# Patient Record
Sex: Female | Born: 1970 | Race: White | Hispanic: No | Marital: Married | Smoking: Never smoker
Health system: Southern US, Community
[De-identification: ages and names within clinical notes are randomized; demographics above are authoritative.]

## PROBLEM LIST (undated history)

## (undated) DIAGNOSIS — K5900 Constipation, unspecified: Secondary | ICD-10-CM

## (undated) DIAGNOSIS — R0602 Shortness of breath: Secondary | ICD-10-CM

## (undated) DIAGNOSIS — M7989 Other specified soft tissue disorders: Secondary | ICD-10-CM

## (undated) DIAGNOSIS — K219 Gastro-esophageal reflux disease without esophagitis: Secondary | ICD-10-CM

## (undated) DIAGNOSIS — M2550 Pain in unspecified joint: Secondary | ICD-10-CM

## (undated) DIAGNOSIS — C801 Malignant (primary) neoplasm, unspecified: Secondary | ICD-10-CM

## (undated) HISTORY — DX: Other specified soft tissue disorders: M79.89

## (undated) HISTORY — DX: Gastro-esophageal reflux disease without esophagitis: K21.9

## (undated) HISTORY — DX: Shortness of breath: R06.02

## (undated) HISTORY — DX: Pain in unspecified joint: M25.50

## (undated) HISTORY — PX: CARDIAC CATHETERIZATION: SHX172

## (undated) HISTORY — DX: Constipation, unspecified: K59.00

## (undated) HISTORY — PX: PORTA CATH REMOVAL: CATH118286

## (undated) HISTORY — PX: ABDOMINAL HYSTERECTOMY: SHX81

---

## 1998-02-19 HISTORY — PX: MENISCUS REPAIR: SHX5179

## 2010-02-19 HISTORY — PX: OTHER SURGICAL HISTORY: SHX169

## 2015-07-04 ENCOUNTER — Encounter: Payer: Self-pay | Admitting: Surgery

## 2015-07-04 ENCOUNTER — Encounter (HOSPITAL_COMMUNITY): Payer: Self-pay

## 2016-07-03 ENCOUNTER — Encounter (HOSPITAL_COMMUNITY): Payer: Self-pay | Admitting: Emergency Medicine

## 2016-07-03 ENCOUNTER — Ambulatory Visit (HOSPITAL_COMMUNITY)
Admission: EM | Admit: 2016-07-03 | Discharge: 2016-07-03 | Disposition: A | Discharge status: Home / Self Care | Payer: BC Managed Care – PPO | Attending: Internal Medicine | Admitting: Internal Medicine

## 2016-07-03 DIAGNOSIS — H109 Unspecified conjunctivitis: Secondary | ICD-10-CM

## 2016-07-03 MED ORDER — FLUORESCEIN SODIUM 0.6 MG OP STRP
ORAL_STRIP | OPHTHALMIC | Status: AC
  Filled 2016-07-03: qty 1

## 2016-07-03 MED ORDER — CIPROFLOXACIN HCL 0.3 % OP SOLN: OPHTHALMIC | 0 refills | Status: DC

## 2016-07-03 MED ORDER — EYE WASH OPHTH SOLN
OPHTHALMIC | Status: AC
  Filled 2016-07-03: qty 118

## 2016-07-03 MED ORDER — TETRACAINE HCL 0.5 % OP SOLN
OPHTHALMIC | Status: AC
  Filled 2016-07-03: qty 2

## 2016-07-03 NOTE — Discharge Instructions (Signed)
You have bacterial conjunctivitis, I prescribed Cipro eyedrops, place 2 drops in the affected eye every 2 hours while awake for the next 2 days, then 2 drops in the affected eye every 4-8 hours for the next 5 days. You are contagious, however after 24 hours on antibiotics this will reduce the chance of spreading. If your vision worsens, develops pain, or other troubling symptoms, go to the emergency room.

## 2016-07-03 NOTE — ED Triage Notes (Signed)
The patient presented to the [facility] with a complaint of right eye burning and redness that started today.

## 2016-07-03 NOTE — ED Provider Notes (Signed)
CSN: [id number]     Arrival date & time 07/03/16  1621 History   First MD Initiated Contact with Patient 07/03/16 1722     Chief Complaint  Patient presents with  . Eye Drainage   (Consider location/radiation/quality/duration/timing/severity/associated sxs/prior Treatment) 46 year old female presents to clinic for evaluation of red eye, and drainage. That started today. She works in a [employer], structural system, and was around Tour manager, who had conjunctivitis. She denies any pain, and she denies any trouble with her vision. She normally wears contacts, however she took them out today, is been wearing glasses. She has no fever, headache, chills, nausea, or any other complaints.   The history is provided by the patient.    History reviewed. No pertinent past medical history. Past Surgical History:  Procedure Laterality Date  . ABDOMINAL HYSTERECTOMY     History reviewed. No pertinent family history. Social History  Substance Use Topics  . Smoking status: Never Smoker  . Smokeless tobacco: Never Used  . Alcohol use Yes   OB History    No data available     Review of Systems  Constitutional: Negative.   HENT: Negative.   Eyes: Positive for discharge, redness and itching. Negative for photophobia and pain.  Respiratory: Negative.   Cardiovascular: Negative.   Gastrointestinal: Negative.   Musculoskeletal: Negative.   Skin: Negative.   Neurological: Negative.     Allergies  Patient has no known allergies.  Home Medications   Prior to Admission medications   Medication Sig Start Date End Date Taking? Authorizing Provider  ciprofloxacin (CILOXAN) 0.3 % ophthalmic solution Administer 2 drops in right eye, every 2 hours, while awake, for 2 days. Then 2 drop, every 4 hours, while awake, for the next 5 days. 07/03/16   Barnet Glasgow, NP   Meds Ordered and Administered this Visit  Medications - No data to display  BP 123/61 (BP Location: Right Arm)   Pulse 73   Temp 98.2 F  (36.8 C) (Oral)   Resp 16   SpO2 100%  No data found.   Physical Exam  Constitutional: She is oriented to person, place, and time. She appears well-developed and well-nourished. No distress.  HENT:  Head: Normocephalic and atraumatic.  Right Ear: External ear normal.  Left Ear: External ear normal.  Eyes: Lids are normal. Pupils are equal, round, and reactive to light. Right eye exhibits chemosis and discharge. Right conjunctiva is injected.  Neck: Normal range of motion.  Lymphadenopathy:    She has no cervical adenopathy.  Neurological: She is alert and oriented to person, place, and time.  Skin: Skin is warm and dry. Capillary refill takes less than 2 seconds. No rash noted. She is not diaphoretic. No erythema.  Psychiatric: She has a normal mood and affect. Her behavior is normal.  Nursing note and vitals reviewed.   Urgent Care Course     Procedures (including critical care time)  Labs Review Labs Reviewed - No data to display  Imaging Review No results found.   Visual Acuity Review  Right Eye Distance: 20/70 Left Eye Distance: 2020 Bilateral Distance: 20/20  Right Eye Near:   Left Eye Near:    Bilateral Near:         MDM   1. Bacterial conjunctivitis    Most likely bacterial conjunctivitis, provided Cipro eyedrops. Provided counseling on hand hygiene, recommend following up with primary care provider in one week as needed, or go to the emergency room at any time symptoms worsen  Barnet Glasgow, NP 07/03/16 1733

## 2016-08-02 DIAGNOSIS — R928 Other abnormal and inconclusive findings on diagnostic imaging of breast: Secondary | ICD-10-CM

## 2016-08-02 DIAGNOSIS — Z1501 Genetic susceptibility to malignant neoplasm of breast: Secondary | ICD-10-CM | POA: Diagnosis not present

## 2017-04-23 DIAGNOSIS — S93401A Sprain of unspecified ligament of right ankle, initial encounter: Secondary | ICD-10-CM | POA: Insufficient documentation

## 2018-02-18 DIAGNOSIS — Z170 Estrogen receptor positive status [ER+]: Principal | ICD-10-CM

## 2018-02-18 DIAGNOSIS — C50511 Malignant neoplasm of lower-outer quadrant of right female breast: Secondary | ICD-10-CM | POA: Insufficient documentation

## 2018-02-19 HISTORY — PX: MASTECTOMY: SHX3

## 2018-02-19 HISTORY — PX: PORTA CATH INSERTION: CATH118285

## 2018-03-18 DIAGNOSIS — Z8042 Family history of malignant neoplasm of prostate: Secondary | ICD-10-CM

## 2018-03-18 DIAGNOSIS — Z8049 Family history of malignant neoplasm of other genital organs: Secondary | ICD-10-CM

## 2018-03-18 DIAGNOSIS — Z9013 Acquired absence of bilateral breasts and nipples: Secondary | ICD-10-CM

## 2018-03-18 DIAGNOSIS — C50511 Malignant neoplasm of lower-outer quadrant of right female breast: Secondary | ICD-10-CM

## 2018-03-18 DIAGNOSIS — Z1509 Genetic susceptibility to other malignant neoplasm: Secondary | ICD-10-CM

## 2018-03-18 DIAGNOSIS — Z8041 Family history of malignant neoplasm of ovary: Secondary | ICD-10-CM

## 2018-03-18 DIAGNOSIS — Z803 Family history of malignant neoplasm of breast: Secondary | ICD-10-CM

## 2018-03-18 DIAGNOSIS — Z170 Estrogen receptor positive status [ER+]: Secondary | ICD-10-CM

## 2018-03-18 DIAGNOSIS — Z90710 Acquired absence of both cervix and uterus: Secondary | ICD-10-CM

## 2018-03-18 DIAGNOSIS — Z1501 Genetic susceptibility to malignant neoplasm of breast: Secondary | ICD-10-CM

## 2018-03-26 DIAGNOSIS — C50511 Malignant neoplasm of lower-outer quadrant of right female breast: Secondary | ICD-10-CM

## 2018-03-26 DIAGNOSIS — Z01810 Encounter for preprocedural cardiovascular examination: Secondary | ICD-10-CM

## 2018-03-28 ENCOUNTER — Institutional Professional Consult (permissible substitution): Payer: BC Managed Care – PPO | Admitting: Plastic Surgery

## 2018-05-01 DIAGNOSIS — Z170 Estrogen receptor positive status [ER+]: Secondary | ICD-10-CM

## 2018-05-01 DIAGNOSIS — C50511 Malignant neoplasm of lower-outer quadrant of right female breast: Secondary | ICD-10-CM

## 2018-05-15 DIAGNOSIS — Z170 Estrogen receptor positive status [ER+]: Secondary | ICD-10-CM

## 2018-05-15 DIAGNOSIS — C50511 Malignant neoplasm of lower-outer quadrant of right female breast: Secondary | ICD-10-CM

## 2018-05-27 ENCOUNTER — Other Ambulatory Visit: Payer: Self-pay

## 2018-05-27 ENCOUNTER — Ambulatory Visit: Payer: BC Managed Care – PPO | Admitting: Plastic Surgery

## 2018-05-27 ENCOUNTER — Encounter

## 2018-05-27 ENCOUNTER — Encounter: Payer: Self-pay | Admitting: Plastic Surgery

## 2018-05-27 VITALS — BP 113/75 | HR 69 | Temp 97.7°F | Ht 63.0 in | Wt 165.0 lb

## 2018-05-27 DIAGNOSIS — C50511 Malignant neoplasm of lower-outer quadrant of right female breast: Secondary | ICD-10-CM

## 2018-05-27 DIAGNOSIS — Z9013 Acquired absence of bilateral breasts and nipples: Secondary | ICD-10-CM | POA: Diagnosis not present

## 2018-05-27 DIAGNOSIS — Z170 Estrogen receptor positive status [ER+]: Secondary | ICD-10-CM

## 2018-05-27 NOTE — Progress Notes (Signed)
Patient ID: Tina Monroe, female    DOB: 02-22-1970, 48 y.o.   MRN: [id number]   Chief Complaint  Patient presents with  . Advice Only    for breast reconstuction  . Breast Problem    Patient is a 48 year old female for consultation for breast reconstruction.  She was diagnosed with cancer and underwent bilateral mastectomies in January 2020 for a nonpalpable invasive lobular cancer.  It was estrogen and progesterone positive and HER-2 negative with a Ki-67 10%.  She finished her chemotherapy 2 weeks ago.  Prior to this, her previous mammogram was 2018.  She is 5 feet 3 inches tall and weighs 165 pounds.  Her preop bra size was a 36 B/C.  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  She has 3 grown daughters and very supportive husband.  Her Port-A-Cath is still in place.  She did not have any radiation.   Review of Systems  Constitutional: Negative.  Negative for activity change and appetite change.  HENT: Negative.   Eyes: Negative.   Respiratory: Negative.  Negative for chest tightness and shortness of breath.   Cardiovascular: Negative.   Gastrointestinal: Negative.  Negative for abdominal distention and abdominal pain.  Genitourinary: Negative.   Musculoskeletal: Negative.  Negative for joint swelling.  Hematological: Negative.   Psychiatric/Behavioral: Negative.     History reviewed. No pertinent past medical history.  Past Surgical History:  Procedure Laterality Date  . ABDOMINAL HYSTERECTOMY        Current Outpatient Medications:  .  amoxicillin-clavulanate (AUGMENTIN) 875-125 MG tablet, amoxicillin 875 mg-potassium clavulanate 125 mg tablet, Disp: , Rfl:  .  ibuprofen (ADVIL,MOTRIN) 600 MG tablet, Use as needed., Disp: , Rfl:  .  ondansetron (ZOFRAN) 4 MG tablet, Take 4 mg by mouth every 4 (four) hours as needed., Disp: , Rfl:  .  valACYclovir (VALTREX) 1000 MG tablet, valacyclovir 1 gram tablet, Disp: , Rfl:    Objective:   Vitals:   05/27/18 0825  BP: 113/75  Pulse: 69  Temp: 97.7 F (36.5 C)  SpO2: 100%    Physical Exam Vitals signs and nursing note reviewed.  Constitutional:      Appearance: Normal appearance.  HENT:     Head: Normocephalic and atraumatic.     Nose: Nose normal.     Mouth/Throat:     Mouth: Mucous membranes are moist.  Eyes:     Extraocular Movements: Extraocular movements intact.  Cardiovascular:     Rate and Rhythm: Normal rate.  Pulmonary:     Effort: Pulmonary effort is normal. No respiratory distress.  Chest:    Abdominal:     General: Abdomen is flat. There is no distension.  Neurological:     General: No focal deficit present.     Mental Status: She is alert.  Psychiatric:        Mood and Affect: Mood normal.        Behavior: Behavior normal.        Thought Content: Thought content normal.     Assessment & Plan:  Malignant neoplasm of lower-outer quadrant of right breast of female, estrogen receptor positive ([address])  History of bilateral mastectomy  Assessment and Plan:  A long, detailed conversation was had regarding the patient's options for breast reconstruction. Five main points, which are explained to all breast reconstruction patients, were discussed.  1. Breast reconstruction is an optional process.  2. Breast reconstruction is a multi-stage process which involves multiple surgeries  spaced several months apart. The entire process can take over one year.  3. The major goal of breast reconstruction is to have the patient look normal in clothing. When naked, there will always be scars.  4. Asymmetries are often present during the reconstruction process. Several operations may be needed, including surgery to the non-cancerous breast, to achieve satisfactory results.  5. No matter the reconstructive method, there are ways that the reconstruction can fail and a secondary reconstructive plan would need to be created.   A general discussion regarding all available methods  of breast reconstruction were discussed. The types of reconstructions described included.  1. Tissue expander and implant based reconstruction, both single and multi-stage approaches.  2. Autologous only reconstructions, including free abdominal-tissue based reconstructions.  3. Combination procedures, particularly latissismus dorsi flaps combined with either expanders or implants.  For each of the reconstruction methods mentioned above, the risks, benefits, alternatives, scarring, and recovery time were discussed in great detail. Specific risks detailed included bleeding, infection, hematoma, seroma, scarring, pain, wound healing complications, flap loss, fat necrosis, capsular contracture, need for implant removal, donor site complications, bulge, hernia, umbilical necrosis, need for urgent reoperation, and need for dressing changes were discussed.   Assessment  Once all reconstruction options were presented, a focused discussion was had regarding the patient's suitability for each of these procedures.  A total of 50 minutes of face-to-face time was spent in this encounter, of which >50% was spent in counseling.  The patient is interested in bilateral reconstruction with implant-based reconstruction.  She does not want to have autologous reconstruction.  She would like to do expander placement with Flex HD followed by implant placement after expansion.  She is thinking to do this around December.  She is an Environmental consultant principal.  She will call and let us know when she is ready to submit. Pictures placed in chart with patient consent.   [place], DO

## 2018-06-25 ENCOUNTER — Other Ambulatory Visit (HOSPITAL_COMMUNITY): Payer: Self-pay | Admitting: Oncology

## 2018-06-25 ENCOUNTER — Other Ambulatory Visit: Payer: Self-pay | Admitting: Oncology

## 2018-06-27 ENCOUNTER — Other Ambulatory Visit (HOSPITAL_COMMUNITY): Payer: Self-pay | Admitting: Oncology

## 2018-06-27 DIAGNOSIS — R931 Abnormal findings on diagnostic imaging of heart and coronary circulation: Secondary | ICD-10-CM

## 2018-07-01 ENCOUNTER — Telehealth (HOSPITAL_COMMUNITY): Payer: Self-pay | Admitting: Emergency Medicine

## 2018-07-01 NOTE — Telephone Encounter (Signed)
Reaching out to patient to offer assistance regarding upcoming cardiac imaging study; pt verbalizes understanding of appt date/time, parking situation and where to check in, and verified current allergies; name and call back number provided for further questions should they arise Marchia Bond RN Navigator Cardiac Imaging [place] [facility] [phone] office [phone] cell  Pt denies covid 19 symptoms, verbalized understanding of visitor policy  Pt was instructed to check in 15-30mins prior to appt She has a port-a-cath in chest, can use for IV access if needed

## 2018-07-02 ENCOUNTER — Ambulatory Visit (HOSPITAL_COMMUNITY)
Admission: RE | Admit: 2018-07-02 | Discharge: 2018-07-02 | Disposition: A | Discharge status: Home / Self Care | Payer: BC Managed Care – PPO | Source: Ambulatory Visit | Attending: Oncology | Admitting: Oncology

## 2018-07-02 ENCOUNTER — Other Ambulatory Visit: Payer: Self-pay

## 2018-07-02 ENCOUNTER — Other Ambulatory Visit (HOSPITAL_COMMUNITY): Payer: BC Managed Care – PPO

## 2018-07-02 DIAGNOSIS — R931 Abnormal findings on diagnostic imaging of heart and coronary circulation: Secondary | ICD-10-CM

## 2018-07-02 DIAGNOSIS — R9430 Abnormal result of cardiovascular function study, unspecified: Secondary | ICD-10-CM

## 2018-07-02 MED ORDER — GADOBUTROL 1 MMOL/ML IV SOLN
10.0000 mL | Freq: Once | INTRAVENOUS | Status: AC | PRN
  Administered 2018-07-02: 8 mL via INTRAVENOUS

## 2018-08-27 ENCOUNTER — Encounter: Payer: Self-pay | Admitting: Plastic Surgery

## 2018-08-27 ENCOUNTER — Other Ambulatory Visit: Payer: Self-pay

## 2018-08-27 ENCOUNTER — Ambulatory Visit (INDEPENDENT_AMBULATORY_CARE_PROVIDER_SITE_OTHER): Payer: BC Managed Care – PPO | Admitting: Plastic Surgery

## 2018-08-27 VITALS — BP 133/76 | HR 71 | Temp 98.4°F | Ht 63.0 in | Wt 170.4 lb

## 2018-08-27 DIAGNOSIS — Z9013 Acquired absence of bilateral breasts and nipples: Secondary | ICD-10-CM

## 2018-08-27 DIAGNOSIS — C50511 Malignant neoplasm of lower-outer quadrant of right female breast: Secondary | ICD-10-CM

## 2018-08-27 DIAGNOSIS — Z170 Estrogen receptor positive status [ER+]: Secondary | ICD-10-CM

## 2018-08-27 MED ORDER — CEPHALEXIN 500 MG PO CAPS: 500.0000 mg | ORAL_CAPSULE | Freq: Four times a day (QID) | ORAL | 0 refills | Status: AC

## 2018-08-27 MED ORDER — HYDROCODONE-ACETAMINOPHEN 5-325 MG PO TABS: 1.0000 | ORAL_TABLET | Freq: Four times a day (QID) | ORAL | 0 refills | Status: DC | PRN

## 2018-08-27 MED ORDER — ONDANSETRON HCL 4 MG PO TABS: 4.0000 mg | ORAL_TABLET | Freq: Three times a day (TID) | ORAL | 0 refills | Status: AC | PRN

## 2018-08-27 MED ORDER — DIAZEPAM 2 MG PO TABS: 2.0000 mg | ORAL_TABLET | Freq: Two times a day (BID) | ORAL | 0 refills | Status: AC | PRN

## 2018-08-27 NOTE — Progress Notes (Signed)
Patient ID: Tina Monroe, female    DOB: 03-13-1970, 48 y.o.   MRN: [id number]   Chief Complaint  Patient presents with  . Pre-op Exam    (B) breast reconstruction w/ expanders and flex HD  . Breast Cancer    The patient is a 48 yrs old female here for a history and physical for breast reconstruction.  She underwent bilateral mastectomies January 2020 for invasive lobular cancer.  It was estrogen and progesterone positive and HER-2 negative.  Chemotherapy was also given and she is completed that.  The Port-A-Cath that was on the left side is out.  She is 5 feet 3 inches tall.  She weighs 170 pounds.  Her preoperative bra size was a 36 B/C.  She would like to be around the same size.  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  She has not had any radiation and no radiation is planned.  She is hoping to have all the reconstruction completed before the end of the year.  At some point she is planning on having a completion hysterectomy.  At this point in time it is scheduled for the beginning of August.   Review of Systems  Constitutional: Negative.  Negative for activity change and appetite change.  HENT: Negative.   Eyes: Negative.   Respiratory: Negative.   Cardiovascular: Negative.  Negative for leg swelling.  Gastrointestinal: Negative.  Negative for abdominal pain.  Endocrine: Negative.   Genitourinary: Negative.   Musculoskeletal: Negative for back pain and joint swelling.  Neurological: Negative.   Hematological: Negative.   Psychiatric/Behavioral: Negative.     History reviewed. No pertinent past medical history.  Past Surgical History:  Procedure Laterality Date  . ABDOMINAL HYSTERECTOMY        Current Outpatient Medications:  .  tamoxifen (NOLVADEX) 20 MG tablet, Take 20 mg by mouth daily., Disp: , Rfl:  .  cephALEXin (KEFLEX) 500 MG capsule, Take 1 capsule (500 mg total) by mouth 4 (four) times daily for 5 days., Disp: 20 capsule, Rfl: 0 .   diazepam (VALIUM) 2 MG tablet, Take 1 tablet (2 mg total) by mouth every 12 (twelve) hours as needed for up to 20 days for muscle spasms., Disp: 40 tablet, Rfl: 0 .  HYDROcodone-acetaminophen (NORCO) 5-325 MG tablet, Take 1 tablet by mouth every 6 (six) hours as needed for moderate pain., Disp: 30 tablet, Rfl: 0 .  ondansetron (ZOFRAN) 4 MG tablet, Take 1 tablet (4 mg total) by mouth every 8 (eight) hours as needed for up to 5 days., Disp: 15 tablet, Rfl: 0   Objective:   Vitals:   08/27/18 1432  BP: 133/76  Pulse: 71  Temp: 98.4 F (36.9 C)  SpO2: 99%    Physical Exam Vitals signs and nursing note reviewed.  Constitutional:      Appearance: Normal appearance.  HENT:     Head: Normocephalic and atraumatic.  Cardiovascular:     Rate and Rhythm: Normal rate.     Pulses: Normal pulses.  Pulmonary:     Effort: Pulmonary effort is normal. No respiratory distress.  Abdominal:     General: Abdomen is flat. There is no distension.  Neurological:     General: No focal deficit present.     Mental Status: She is alert and oriented to person, place, and time. Mental status is at baseline.  Psychiatric:        Mood and Affect: Mood normal.  Behavior: Behavior normal.        Thought Content: Thought content normal.     Assessment & Plan:  Malignant neoplasm of lower-outer quadrant of right breast of female, estrogen receptor positive ([place])  History of bilateral mastectomy Plan for bilateral placement of breast expanders for breast reconstruction. Prescription sent to pharmacy. The risks that can be encountered with and after placement of a breast expander placement were discussed and include the following but not limited to these: bleeding, infection, delayed healing, anesthesia risks, skin sensation changes, injury to structures including nerves, blood vessels, and muscles which may be temporary or permanent, allergies to tape, suture materials and glues, blood products, topical  preparations or injected agents, skin contour irregularities, skin discoloration and swelling, deep vein thrombosis, cardiac and pulmonary complications, pain, which may persist, fluid accumulation, wrinkling of the skin over the expander, changes in nipple or breast sensation, expander leakage or rupture, faulty position of the expander, persistent pain, formation of tight scar tissue around the expander (capsular contracture), possible need for revisional surgery or staged procedures.   [place], DO

## 2018-09-12 DIAGNOSIS — C50511 Malignant neoplasm of lower-outer quadrant of right female breast: Secondary | ICD-10-CM

## 2018-09-25 HISTORY — PX: ABDOMINAL HYSTERECTOMY: SHX81

## 2018-10-16 ENCOUNTER — Other Ambulatory Visit: Payer: Self-pay

## 2018-10-16 ENCOUNTER — Encounter (HOSPITAL_BASED_OUTPATIENT_CLINIC_OR_DEPARTMENT_OTHER): Payer: Self-pay

## 2018-10-20 ENCOUNTER — Other Ambulatory Visit (HOSPITAL_COMMUNITY)
Admission: RE | Admit: 2018-10-20 | Discharge: 2018-10-20 | Disposition: A | Discharge status: Home / Self Care | Payer: BC Managed Care – PPO | Source: Ambulatory Visit | Attending: Plastic Surgery | Admitting: Plastic Surgery

## 2018-10-20 DIAGNOSIS — C50511 Malignant neoplasm of lower-outer quadrant of right female breast: Secondary | ICD-10-CM | POA: Insufficient documentation

## 2018-10-20 DIAGNOSIS — Z170 Estrogen receptor positive status [ER+]: Secondary | ICD-10-CM | POA: Insufficient documentation

## 2018-10-20 DIAGNOSIS — Z01812 Encounter for preprocedural laboratory examination: Secondary | ICD-10-CM | POA: Diagnosis present

## 2018-10-20 DIAGNOSIS — Z20828 Contact with and (suspected) exposure to other viral communicable diseases: Secondary | ICD-10-CM | POA: Diagnosis not present

## 2018-10-20 LAB — SARS CORONAVIRUS 2 (TAT 6-24 HRS): SARS Coronavirus 2: NEGATIVE

## 2018-10-23 ENCOUNTER — Encounter (HOSPITAL_BASED_OUTPATIENT_CLINIC_OR_DEPARTMENT_OTHER)
Admission: RE | Disposition: A | Discharge status: Home / Self Care | Payer: Self-pay | Source: Home / Self Care | Attending: Plastic Surgery

## 2018-10-23 ENCOUNTER — Ambulatory Visit (HOSPITAL_BASED_OUTPATIENT_CLINIC_OR_DEPARTMENT_OTHER): Payer: BC Managed Care – PPO | Admitting: Certified Registered"

## 2018-10-23 ENCOUNTER — Encounter (HOSPITAL_BASED_OUTPATIENT_CLINIC_OR_DEPARTMENT_OTHER): Payer: Self-pay

## 2018-10-23 ENCOUNTER — Other Ambulatory Visit: Payer: Self-pay

## 2018-10-23 ENCOUNTER — Ambulatory Visit (HOSPITAL_BASED_OUTPATIENT_CLINIC_OR_DEPARTMENT_OTHER)
Admission: RE | Admit: 2018-10-23 | Discharge: 2018-10-23 | Disposition: A | Discharge status: Home / Self Care | Payer: BC Managed Care – PPO | Attending: Plastic Surgery | Admitting: Plastic Surgery

## 2018-10-23 DIAGNOSIS — Z9013 Acquired absence of bilateral breasts and nipples: Secondary | ICD-10-CM | POA: Diagnosis not present

## 2018-10-23 DIAGNOSIS — Z9221 Personal history of antineoplastic chemotherapy: Secondary | ICD-10-CM | POA: Insufficient documentation

## 2018-10-23 DIAGNOSIS — Z79810 Long term (current) use of selective estrogen receptor modulators (SERMs): Secondary | ICD-10-CM | POA: Diagnosis not present

## 2018-10-23 DIAGNOSIS — C50919 Malignant neoplasm of unspecified site of unspecified female breast: Secondary | ICD-10-CM | POA: Diagnosis not present

## 2018-10-23 DIAGNOSIS — Z421 Encounter for breast reconstruction following mastectomy: Secondary | ICD-10-CM | POA: Insufficient documentation

## 2018-10-23 DIAGNOSIS — Z853 Personal history of malignant neoplasm of breast: Secondary | ICD-10-CM

## 2018-10-23 HISTORY — PX: BREAST RECONSTRUCTION WITH PLACEMENT OF TISSUE EXPANDER AND FLEX HD (ACELLULAR HYDRATED DERMIS): SHX6295

## 2018-10-23 HISTORY — DX: Malignant (primary) neoplasm, unspecified: C80.1

## 2018-10-23 SURGERY — BREAST RECONSTRUCTION WITH PLACEMENT OF TISSUE EXPANDER AND FLEX HD (ACELLULAR HYDRATED DERMIS)
Anesthesia: General | Site: Breast | Laterality: Bilateral

## 2018-10-23 MED ORDER — SODIUM CHLORIDE 0.9% FLUSH: 3.0000 mL | INTRAVENOUS | Status: DC | PRN

## 2018-10-23 MED ORDER — HYDROCODONE-ACETAMINOPHEN 5-325 MG PO TABS
ORAL_TABLET | ORAL | Status: AC
  Filled 2018-10-23: qty 1

## 2018-10-23 MED ORDER — PROPOFOL 10 MG/ML IV BOLUS
INTRAVENOUS | Status: AC
  Filled 2018-10-23: qty 20

## 2018-10-23 MED ORDER — PROPOFOL 10 MG/ML IV BOLUS
INTRAVENOUS | Status: DC | PRN
  Administered 2018-10-23: 170 mg via INTRAVENOUS

## 2018-10-23 MED ORDER — FENTANYL CITRATE (PF) 100 MCG/2ML IJ SOLN
INTRAMUSCULAR | Status: AC
  Filled 2018-10-23: qty 2

## 2018-10-23 MED ORDER — SODIUM CHLORIDE 0.9 % IV SOLN
INTRAVENOUS | Status: DC | PRN
  Administered 2018-10-23: 500 mL

## 2018-10-23 MED ORDER — FENTANYL CITRATE (PF) 100 MCG/2ML IJ SOLN: 25.0000 ug | INTRAMUSCULAR | Status: DC | PRN

## 2018-10-23 MED ORDER — MIDAZOLAM HCL 2 MG/2ML IJ SOLN
INTRAMUSCULAR | Status: AC
  Filled 2018-10-23: qty 2

## 2018-10-23 MED ORDER — ONDANSETRON HCL 4 MG/2ML IJ SOLN
INTRAMUSCULAR | Status: AC
  Filled 2018-10-23: qty 2

## 2018-10-23 MED ORDER — FENTANYL CITRATE (PF) 100 MCG/2ML IJ SOLN
INTRAMUSCULAR | Status: DC | PRN
  Administered 2018-10-23 (×3): 25 ug via INTRAVENOUS
  Administered 2018-10-23: 50 ug via INTRAVENOUS
  Administered 2018-10-23 (×3): 25 ug via INTRAVENOUS

## 2018-10-23 MED ORDER — ACETAMINOPHEN 650 MG RE SUPP: 650.0000 mg | RECTAL | Status: DC | PRN

## 2018-10-23 MED ORDER — SODIUM CHLORIDE 0.9% FLUSH: 3.0000 mL | Freq: Two times a day (BID) | INTRAVENOUS | Status: DC

## 2018-10-23 MED ORDER — HYDROCODONE-ACETAMINOPHEN 5-325 MG PO TABS
1.0000 | ORAL_TABLET | Freq: Once | ORAL | Status: AC
  Administered 2018-10-23: 1 via ORAL

## 2018-10-23 MED ORDER — LACTATED RINGERS IV SOLN
INTRAVENOUS | Status: DC
  Administered 2018-10-23 (×2): via INTRAVENOUS

## 2018-10-23 MED ORDER — GLYCOPYRROLATE 0.2 MG/ML IJ SOLN
INTRAMUSCULAR | Status: DC | PRN
  Administered 2018-10-23: 0.2 mg via INTRAVENOUS

## 2018-10-23 MED ORDER — DEXAMETHASONE SODIUM PHOSPHATE 10 MG/ML IJ SOLN
INTRAMUSCULAR | Status: AC
  Filled 2018-10-23: qty 1

## 2018-10-23 MED ORDER — MEPERIDINE HCL 25 MG/ML IJ SOLN: 6.2500 mg | INTRAMUSCULAR | Status: DC | PRN

## 2018-10-23 MED ORDER — LIDOCAINE 2% (20 MG/ML) 5 ML SYRINGE
INTRAMUSCULAR | Status: DC | PRN
  Administered 2018-10-23: 100 mg via INTRAVENOUS

## 2018-10-23 MED ORDER — BUPIVACAINE-EPINEPHRINE (PF) 0.25% -1:200000 IJ SOLN
INTRAMUSCULAR | Status: AC
  Filled 2018-10-23: qty 30

## 2018-10-23 MED ORDER — FENTANYL CITRATE (PF) 100 MCG/2ML IJ SOLN
25.0000 ug | INTRAMUSCULAR | Status: DC | PRN
  Administered 2018-10-23: 50 ug via INTRAVENOUS
  Administered 2018-10-23 (×2): 25 ug via INTRAVENOUS

## 2018-10-23 MED ORDER — SODIUM CHLORIDE 0.9 % IV SOLN
INTRAVENOUS | Status: AC
  Filled 2018-10-23: qty 500000

## 2018-10-23 MED ORDER — SODIUM CHLORIDE 0.9 % IV SOLN: 250.0000 mL | INTRAVENOUS | Status: DC | PRN

## 2018-10-23 MED ORDER — CHLORHEXIDINE GLUCONATE CLOTH 2 % EX PADS: 6.0000 | MEDICATED_PAD | Freq: Once | CUTANEOUS | Status: DC

## 2018-10-23 MED ORDER — METOCLOPRAMIDE HCL 5 MG/ML IJ SOLN: 10.0000 mg | Freq: Once | INTRAMUSCULAR | Status: DC | PRN

## 2018-10-23 MED ORDER — ONDANSETRON HCL 4 MG/2ML IJ SOLN
INTRAMUSCULAR | Status: DC | PRN
  Administered 2018-10-23: 4 mg via INTRAVENOUS

## 2018-10-23 MED ORDER — ACETAMINOPHEN 10 MG/ML IV SOLN
INTRAVENOUS | Status: AC
  Filled 2018-10-23: qty 100

## 2018-10-23 MED ORDER — OXYCODONE HCL 5 MG PO TABS: 5.0000 mg | ORAL_TABLET | Freq: Once | ORAL | Status: DC

## 2018-10-23 MED ORDER — DEXAMETHASONE SODIUM PHOSPHATE 10 MG/ML IJ SOLN
INTRAMUSCULAR | Status: DC | PRN
  Administered 2018-10-23: 4 mg via INTRAVENOUS

## 2018-10-23 MED ORDER — ROCURONIUM BROMIDE 50 MG/5ML IV SOLN
INTRAVENOUS | Status: AC
  Filled 2018-10-23: qty 3

## 2018-10-23 MED ORDER — ACETAMINOPHEN 10 MG/ML IV SOLN: 1000.0000 mg | Freq: Once | INTRAVENOUS | Status: DC

## 2018-10-23 MED ORDER — MIDAZOLAM HCL 5 MG/5ML IJ SOLN
INTRAMUSCULAR | Status: DC | PRN
  Administered 2018-10-23: 2 mg via INTRAVENOUS

## 2018-10-23 MED ORDER — BUPIVACAINE-EPINEPHRINE 0.25% -1:200000 IJ SOLN
INTRAMUSCULAR | Status: DC | PRN
  Administered 2018-10-23: 10 mL

## 2018-10-23 MED ORDER — SCOPOLAMINE 1 MG/3DAYS TD PT72: 1.0000 | MEDICATED_PATCH | Freq: Once | TRANSDERMAL | Status: DC

## 2018-10-23 MED ORDER — CEFAZOLIN SODIUM-DEXTROSE 2-4 GM/100ML-% IV SOLN
2.0000 g | INTRAVENOUS | Status: AC
  Administered 2018-10-23: 2 g via INTRAVENOUS

## 2018-10-23 MED ORDER — LACTATED RINGERS IV SOLN: INTRAVENOUS | Status: DC

## 2018-10-23 MED ORDER — ACETAMINOPHEN 325 MG PO TABS: 650.0000 mg | ORAL_TABLET | ORAL | Status: DC | PRN

## 2018-10-23 MED ORDER — CEFAZOLIN SODIUM-DEXTROSE 2-4 GM/100ML-% IV SOLN
INTRAVENOUS | Status: AC
  Filled 2018-10-23: qty 100

## 2018-10-23 SURGICAL SUPPLY — 59 items
BAG DECANTER FOR FLEXI CONT (MISCELLANEOUS) ×2 IMPLANT
BINDER BREAST LRG (GAUZE/BANDAGES/DRESSINGS) IMPLANT
BINDER BREAST XLRG (GAUZE/BANDAGES/DRESSINGS) ×2 IMPLANT
BIOPATCH RED 1 DISK 7.0 (GAUZE/BANDAGES/DRESSINGS) ×4 IMPLANT
BLADE HEX COATED 2.75 (ELECTRODE) ×2 IMPLANT
BLADE SURG 15 STRL LF DISP TIS (BLADE) ×1 IMPLANT
BLADE SURG 15 STRL SS (BLADE) ×1
CANISTER SUCT 1200ML W/VALVE (MISCELLANEOUS) ×2 IMPLANT
CHLORAPREP W/TINT 26 (MISCELLANEOUS) ×2 IMPLANT
COVER BACK TABLE REUSABLE LG (DRAPES) ×2 IMPLANT
COVER MAYO STAND REUSABLE (DRAPES) ×2 IMPLANT
DECANTER SPIKE VIAL GLASS SM (MISCELLANEOUS) ×2 IMPLANT
DERMABOND ADVANCED (GAUZE/BANDAGES/DRESSINGS) ×2
DERMABOND ADVANCED .7 DNX12 (GAUZE/BANDAGES/DRESSINGS) ×2 IMPLANT
DRAIN CHANNEL 19F RND (DRAIN) ×4 IMPLANT
DRAPE LAPAROSCOPIC ABDOMINAL (DRAPES) ×2 IMPLANT
DRSG PAD ABDOMINAL 8X10 ST (GAUZE/BANDAGES/DRESSINGS) ×4 IMPLANT
ELECT BLADE 4.0 EZ CLEAN MEGAD (MISCELLANEOUS) ×2
ELECT REM PT RETURN 9FT ADLT (ELECTROSURGICAL) ×2
ELECTRODE BLDE 4.0 EZ CLN MEGD (MISCELLANEOUS) ×1 IMPLANT
ELECTRODE REM PT RTRN 9FT ADLT (ELECTROSURGICAL) ×1 IMPLANT
EVACUATOR SILICONE 100CC (DRAIN) ×4 IMPLANT
GLOVE BIO SURGEON STRL SZ 6.5 (GLOVE) ×4 IMPLANT
GLOVE BIO SURGEON STRL SZ7 (GLOVE) ×2 IMPLANT
GLOVE BIOGEL PI IND STRL 7.0 (GLOVE) ×1 IMPLANT
GLOVE BIOGEL PI INDICATOR 7.0 (GLOVE) ×1
GOWN STRL REUS W/ TWL LRG LVL3 (GOWN DISPOSABLE) ×3 IMPLANT
GOWN STRL REUS W/TWL LRG LVL3 (GOWN DISPOSABLE) ×3
GRAFT FLEX HD 6X16 PLIABLE (Tissue) ×4 IMPLANT
IMPL EXPANDER BREAST 535CC (Breast) ×2 IMPLANT
IMPLANT BREAST 535CC (Breast) ×2 IMPLANT
IMPLANT EXPANDER BREAST 535CC (Breast) ×2 IMPLANT
IV NS 1000ML (IV SOLUTION) ×1
IV NS 1000ML BAXH (IV SOLUTION) ×1 IMPLANT
KIT FILL SYSTEM UNIVERSAL (SET/KITS/TRAYS/PACK) IMPLANT
NEEDLE HYPO 25X1 1.5 SAFETY (NEEDLE) ×2 IMPLANT
PACK BASIN DAY SURGERY FS (CUSTOM PROCEDURE TRAY) ×2 IMPLANT
PENCIL BUTTON HOLSTER BLD 10FT (ELECTRODE) ×2 IMPLANT
PIN SAFETY STERILE (MISCELLANEOUS) ×2 IMPLANT
SET ASEPTIC TRANSFER (MISCELLANEOUS) ×4 IMPLANT
SLEEVE SCD COMPRESS KNEE MED (MISCELLANEOUS) ×2 IMPLANT
SPONGE LAP 18X18 RF (DISPOSABLE) ×4 IMPLANT
STRIP SUTURE WOUND CLOSURE 1/2 (SUTURE) ×4 IMPLANT
SUT MNCRL AB 4-0 PS2 18 (SUTURE) ×4 IMPLANT
SUT MON AB 3-0 SH 27 (SUTURE) ×2
SUT MON AB 3-0 SH27 (SUTURE) ×2 IMPLANT
SUT MON AB 5-0 PS2 18 (SUTURE) ×4 IMPLANT
SUT PDS 3-0 CT2 (SUTURE)
SUT PDS AB 2-0 CT2 27 (SUTURE) ×6 IMPLANT
SUT PDS II 3-0 CT2 27 ABS (SUTURE) IMPLANT
SUT SILK 3 0 PS 1 (SUTURE) ×4 IMPLANT
SUT VIC AB 3-0 SH 27 (SUTURE)
SUT VIC AB 3-0 SH 27X BRD (SUTURE) IMPLANT
SYR BULB IRRIGATION 50ML (SYRINGE) ×2 IMPLANT
SYR CONTROL 10ML LL (SYRINGE) ×2 IMPLANT
TOWEL GREEN STERILE FF (TOWEL DISPOSABLE) ×4 IMPLANT
TUBE CONNECTING 20X1/4 (TUBING) ×2 IMPLANT
UNDERPAD 30X36 HEAVY ABSORB (UNDERPADS AND DIAPERS) ×4 IMPLANT
YANKAUER SUCT BULB TIP NO VENT (SUCTIONS) ×2 IMPLANT

## 2018-10-23 NOTE — Anesthesia Procedure Notes (Signed)
Procedure Name: LMA Insertion Date/Time: 10/23/2018 7:41 AM Performed by: Lavonia Dana, CRNA Pre-anesthesia Checklist: Patient identified, Emergency Drugs available, Suction available and Patient being monitored Patient Re-evaluated:Patient Re-evaluated prior to induction Oxygen Delivery Method: Circle system utilized Preoxygenation: Pre-oxygenation with 100% oxygen Induction Type: IV induction Ventilation: Mask ventilation without difficulty LMA: LMA inserted LMA Size: 4.0 Number of attempts: 1 Airway Equipment and Method: Bite block Placement Confirmation: positive ETCO2 Tube secured with: Tape Dental Injury: Teeth and Oropharynx as per pre-operative assessment

## 2018-10-23 NOTE — Discharge Instructions (Signed)
INSTRUCTIONS FOR AFTER BREAST SURGERY   You are getting ready to undergo breast surgery.  You will likely have some questions about what to expect following your operation.  The following information will help you and your family understand what to expect when you are discharged from the hospital.  Following these guidelines will help ensure a smooth recovery and reduce risks of complications.   Postoperative instructions include information on: diet, wound care, medications and physical activity.  AFTER SURGERY Expect to go home after the procedure.  In some cases, you may need to spend one night in the hospital for observation.  DIET Breast surgery does not require a specific diet.  However, I have to mention that the healthier you eat the better your body can start healing. It is important to increasing your protein intake.  This means limiting the foods with sugar and carbohydrates.  Focus on vegetables and some meat.  If you have any liposuction during your procedure be sure to drink water.  If your urine is bright yellow, then it is concentrated, and you need to drink more water.  As a general rule after surgery, you should have 8 ounces of water every hour while awake.  If you find you are persistently nauseated or unable to take in liquids let us know.  NO TOBACCO USE or EXPOSURE.  This will slow your healing process and increase the risk of a wound.  WOUND CARE If you don't have a drain:  You can shower the day after surgery. Use fragrance free soap.  Dial, [place] and [place] are usually mild on the skin. If you have a drain: You can shower five days after surgery.  Clean with baby wipes until the drain is removed.    If you have steri-strips / tape directly attached to your skin leave them in place. It is OK to get these wet.  No baths, pools or hot tubs for two weeks. We close your incision to leave the smallest and best-looking scar. No ointment or creams on your incisions until given the go  ahead.  Especially not Neosporin (Too many skin reactions with this one).  A few weeks after surgery you can use Mederma and start massaging the scar. We ask you to wear your binder or sports bra for the first 6 weeks around the clock, including while sleeping. This provides added comfort and helps reduce the fluid accumulation at the surgery site.  ACTIVITY No heavy lifting until cleared by the doctor.  This usually means no more than a half-gallon of milk.  It is OK to walk and climb stairs. In fact, moving your legs is very important to decrease your risk of a blood clot.  It will also help keep you from getting deconditioned.  Every 1 to 2 hours get up and walk for 5 minutes. This will help with a quicker recovery back to normal.  Let pain be your guide so you don't do too much.  NO, you cannot do the spring cleaning and don't plan on taking care of anyone else.  This is your time for TLC.  You will be more comfortable if you sleep and rest with your head elevated either with a few pillows under you or in a recliner.  No stomach sleeping for a few months.  WORK Everyone returns to work at different times. As a rough guide, most people take at least 1 - 2 weeks off prior to returning to work. If you need documentation  for your job, bring the forms to your postoperative follow up visit.  DRIVING Arrange for someone to bring you home from the hospital.  You may be able to drive a few days after surgery but not while taking any narcotics or valium.  BOWEL MOVEMENTS Constipation can occur after anesthesia and while taking pain medication.  It is important to stay ahead for your comfort.  We recommend taking Milk of Magnesia (2 tablespoons; twice a day) while taking the pain pills.  SEROMA This is fluid your body tried to put in the surgical site.  This is normal but if it creates tight skinny skin let us know.  It usually decreases in a few weeks.  WHEN TO CALL Call your surgeon's office if any of  the following occur:  Fever 101 degrees F or greater  Excessive bleeding or fluid from the incision site.  Pain that increases over time without aid from the medications  Redness, warmth, or pus draining from incision sites  Persistent nausea or inability to take in liquids  Severe misshapen area that underwent the operation.  Here are some resources:  1. Plastic surgery website: https://www.plasticsurgery.org/for-medical-professionals/education-and-resources/publications/breast-reconstruction-magazine 2. Breast Reconstruction Awareness Campaign:  [employer]co[employer]nz 3. Plastic surgery Implant information:  https://www.plasticsurgery.org/patient-safety/breast-implant-safety [facility] Plastic Surgery Specialist  What is the benefit of having a drain?  During surgery your tissue layers are separated.  This raw surface stimulates your body to fill the space with serous fluid.  This is normal but you don't want that fluid to collect and prevent healing.  A fluid collection can also become infected.  The Jackson-Pratt (JP) drain is used to eliminate this collection of fluid and allow the tissue to heal together.    Jackson-Pratt (JP) bulb    How to care for your drainage and suction unit at home Your drainage catheter will be connected to a collection device. The vacuum caused when the device is compressed allows drainage to collect in the device.     Wash your hands with soap and water before and after touching the system.  Empty the JP drain every 12 hours once you get home from your procedure.  Record the fluid amount on the record sheet included.  Start with stripping the drain tube to push the clots or excess fluid to the bulb.  Do this by pinching the tube with one hand near your skin.  Then with the other hand squeeze the tubing and work it toward the bulb.  This should be done several times a day.  This may collapse the tube which will correct on its own.    Use a  safety pin to attach your collection device to your clothing so there is no tension on the insertion site.    If you have drainage at the skin insertion site, you can apply a gauze dressing and secure it with tape.  If the drain falls out, apply a gauze dressing over the drain insertion site and secure with tape.   To empty the collection device:    Release the stopper on the top of the collection unit (bulb).   Pour contents into a measuring container such as a plastic medicine cup.   Record the day and amount of drainage on the attached sheet.  This should be done at least twice a day.    To compress the Jackson-Pratt Bulb:   Release the stopper at the top of the bulb.  Squeeze the bulb tightly in your fist, squeezing air out of  the bulb.   Replace the stopper while the bulb is compressed.   Be careful not to spill the contents when squeezing the bulb.  The drainage will start bright red and turn to pink and then yellow with time.  IMPORTANT: If the bulb is not squeezed before adding the stopper it will not draw out the fluid.  Care for the JP drain site and your skin daily:   You may shower three days after surgery.  Secure the drain to a ribbon or cloth around your waist while showering so it does not pull out while showering.  Be sure your hands are cleaned with soap and water.  Use a clean wet cotton swab to clean the skin around the drain site.   Use another cotton swab to place Vaseline or antibiotic ointment on the skin around the drain.     Contact your physician if any of the following occur:   The fluid in the bulb becomes cloudy.  Your temperature is greater than 101.4.   The incision opens.  If you have drainage at the skin insertion site, you can apply a gauze dressing and secure it with tape.  If the drain falls out, apply a gauze dressing over the drain insertion site and secure with tape.   You will usually have more drainage when you are active  than while you rest or are asleep. If the drainage increases significantly or is bloody call the physician                             Bring this record with you to each office visit Date  Drainage Volume  Date   Drainage volume                                                                                                                                                                                            Post Anesthesia Home Care Instructions  Activity: Get plenty of rest for the remainder of the day. A responsible individual must stay with you for 24 hours following the procedure.  For the next 24 hours, DO NOT: -Drive a car -Paediatric nurse -Drink alcoholic beverages -Take any medication unless instructed by your physician -Make any legal decisions or sign important papers.  Meals: Start with liquid foods such as gelatin or soup. Progress to regular foods as tolerated. Avoid greasy, spicy, heavy foods. If nausea and/or vomiting occur, drink only clear liquids until the nausea and/or vomiting subsides. Call your physician if vomiting continues.  Special Instructions/Symptoms: Your throat may feel dry or sore  from the anesthesia or the breathing tube placed in your throat during surgery. If this causes discomfort, gargle with warm salt water. The discomfort should disappear within 24 hours.  If you had a scopolamine patch placed behind your ear for the management of post- operative nausea and/or vomiting:  1. The medication in the patch is effective for 72 hours, after which it should be removed.  Wrap patch in a tissue and discard in the trash. Wash hands thoroughly with soap and water. 2. You may remove the patch earlier than 72 hours if you experience unpleasant side effects which may include dry mouth, dizziness or visual disturbances. 3. Avoid touching the patch. Wash your hands with soap and water after contact with the patch.

## 2018-10-23 NOTE — Anesthesia Postprocedure Evaluation (Signed)
Anesthesia Post Note  Patient: LIMA SQUARE  Procedure(s) Performed: BREAST RECONSTRUCTION WITH PLACEMENT OF TISSUE EXPANDER AND FLEX HD (ACELLULAR HYDRATED DERMIS) (Bilateral Breast)     Patient location during evaluation: PACU Anesthesia Type: General Level of consciousness: awake and alert Pain management: pain level controlled Vital Signs Assessment: post-procedure vital signs reviewed and stable Respiratory status: spontaneous breathing, nonlabored ventilation, respiratory function stable and patient connected to nasal cannula oxygen Cardiovascular status: blood pressure returned to baseline and stable Postop Assessment: no apparent nausea or vomiting Anesthetic complications: no    Last Vitals:  Vitals:   10/23/18 1015 10/23/18 1030  BP: (!) 142/75 138/78  Pulse: 60 (!) 58  Resp: 14 15  Temp:    SpO2: 99% 94%    Last Pain:  Vitals:   10/23/18 1030  TempSrc:   PainSc: Asleep                 Montez Hageman

## 2018-10-23 NOTE — Anesthesia Preprocedure Evaluation (Signed)
Anesthesia Evaluation  Patient identified by MRN, date of birth, ID band Patient awake    Reviewed: Allergy & Precautions, NPO status , Patient's Chart, lab work & pertinent test results  Airway Mallampati: II  TM Distance: >3 FB Neck ROM: Full    Dental no notable dental hx.    Pulmonary neg pulmonary ROS,    Pulmonary exam normal breath sounds clear to auscultation       Cardiovascular negative cardio ROS Normal cardiovascular exam Rhythm:Regular Rate:Normal     Neuro/Psych negative neurological ROS  negative psych ROS   GI/Hepatic negative GI ROS, Neg liver ROS,   Endo/Other  negative endocrine ROS  Renal/GU negative Renal ROS  negative genitourinary   Musculoskeletal negative musculoskeletal ROS (+)   Abdominal   Peds negative pediatric ROS (+)  Hematology negative hematology ROS (+)   Anesthesia Other Findings   Reproductive/Obstetrics negative OB ROS                             Anesthesia Physical Anesthesia Plan  ASA: II  Anesthesia Plan: General   Post-op Pain Management:    Induction: Intravenous  PONV Risk Score and Plan: 3 and Ondansetron, Dexamethasone, Midazolam and Treatment may vary due to age or medical condition  Airway Management Planned: LMA  Additional Equipment:   Intra-op Plan:   Post-operative Plan: Extubation in OR  Informed Consent: I have reviewed the patients History and Physical, chart, labs and discussed the procedure including the risks, benefits and alternatives for the proposed anesthesia with the patient or authorized representative who has indicated his/her understanding and acceptance.     Dental advisory given  Plan Discussed with: CRNA  Anesthesia Plan Comments:         Anesthesia Quick Evaluation

## 2018-10-23 NOTE — H&P (Signed)
Tina Monroe is an 48 y.o. female.   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 She is 5 feet 3 inches tall and weighs 170 pounds.  Her preop bra size was 36 B/C.  She has not had any radiation.  She has decided on implant based reconstruction.  Past Medical History:  Diagnosis Date  . Cancer [facility])    breast cancer    Past Surgical History:  Procedure Laterality Date  . ABDOMINAL HYSTERECTOMY  09/25/2018   partial this year, other partial in 2003  . CARDIAC CATHETERIZATION     post chemo, everything normal  . MASTECTOMY  02/2018  . MENISCUS REPAIR  2000  . PORTA CATH INSERTION  02/2018  . PORTA CATH REMOVAL      History reviewed. No pertinent family history. Social History:  reports that she has never smoked. She has never used smokeless tobacco. She reports previous alcohol use. She reports that she does not use drugs.  Allergies:  Allergies  Allergen Reactions  . Dilaudid [Hydromorphone Hcl] Itching    Medications Prior to Admission  Medication Sig Dispense Refill  . tamoxifen (NOLVADEX) 20 MG tablet Take 20 mg by mouth daily.    Marland Kitchen HYDROcodone-acetaminophen (NORCO) 5-325 MG tablet Take 1 tablet by mouth every 6 (six) hours as needed for moderate pain. 30 tablet 0    No results found for this or any previous visit (from the past 48 hour(s)). No results found.  Review of Systems  Constitutional: Negative.   HENT: Negative.   Eyes: Negative.   Respiratory: Negative.   Cardiovascular: Negative.   Gastrointestinal: Negative.   Genitourinary: Negative.   Musculoskeletal: Negative.   Skin: Negative.   Neurological: Negative.   Psychiatric/Behavioral: Negative.     Blood pressure 122/73, pulse 61, temperature 97.9 F (36.6 C), temperature source Oral, resp. rate 18, height 5\' 3"  (1.6 m), weight 77  kg, SpO2 100 %. Physical Exam  Constitutional: She is oriented to person, place, and time. She appears well-developed and well-nourished.  HENT:  Head: Normocephalic and atraumatic.  Eyes: Pupils are equal, round, and reactive to light. EOM are normal.  Cardiovascular: Normal rate.  Respiratory: Effort normal.  GI: Soft. She exhibits no distension. There is no abdominal tenderness.  Neurological: She is alert and oriented to person, place, and time.  Skin: No rash noted. No erythema.  Psychiatric: She has a normal mood and affect. Her behavior is normal. Judgment and thought content normal.     Assessment/Plan Bilateral breast reconstruction with expanders and Flex HD placement. The risks that can be encountered with and after placement of a breast expander placement were discussed and include the following but not limited to these: bleeding, infection, delayed healing, anesthesia risks, skin sensation changes, injury to structures including nerves, blood vessels, and muscles which may be temporary or permanent, allergies to tape, suture materials and glues, blood products, topical preparations or injected agents, skin contour irregularities, skin discoloration and swelling, deep vein thrombosis, cardiac and pulmonary complications, pain, which may persist, fluid accumulation, wrinkling of the skin over the expander, changes in nipple or breast sensation, expander leakage or rupture, faulty position of the expander, persistent pain, formation of tight scar tissue around the expander (capsular contracture), possible need for revisional surgery or staged procedures.     [place], DO 10/23/2018, 7:02 AM

## 2018-10-23 NOTE — Transfer of Care (Signed)
Immediate Anesthesia Transfer of Care Note  Patient: Tina Monroe  Procedure(s) Performed: BREAST RECONSTRUCTION WITH PLACEMENT OF TISSUE EXPANDER AND FLEX HD (ACELLULAR HYDRATED DERMIS) (Bilateral Breast)  Patient Location: PACU  Anesthesia Type:General  Level of Consciousness: drowsy  Airway & Oxygen Therapy: Patient Spontanous Breathing and Patient connected to face mask oxygen  Post-op Assessment: Report given to RN and Post -op Vital signs reviewed and stable  Post vital signs: Reviewed and stable  Last Vitals:  Vitals Value Taken Time  BP 118/85 10/23/18 0933  Temp    Pulse 83 10/23/18 0936  Resp 16 10/23/18 0936  SpO2 100 % 10/23/18 0936  Vitals shown include unvalidated device data.  Last Pain:  Vitals:   10/23/18 0656  TempSrc: Oral  PainSc: 0-No pain         Complications: No apparent anesthesia complications

## 2018-10-23 NOTE — Op Note (Signed)
Op report    DATE OF OPERATION:  10/23/2018  LOCATION: [place]  SURGICAL DIVISION: Plastic Surgery  PREOPERATIVE DIAGNOSES:  1. History of Breast cancer.   2. Acquired absence of bilateral breasts.  POSTOPERATIVE DIAGNOSES:   same   PROCEDURE:  1. Bilateral breast reconstruction with placement of Acellular Dermal Matrix and tissue expanders.  SURGEON: Shermon Bozzi Sanger Jonatan Wilsey, DO  ASSISTANT:  Roetta Sessions, PA  ANESTHESIA:  General.   COMPLICATIONS: None.   IMPLANTS: Left - Mentor 535 cc. Ref [id number].  Serial Number [id number], 200 cc of injectable saline placed in the expander. Right - Mentor 535 cc. Ref [id number].  Serial Number [id number], 200 cc of injectable saline placed in the expander. Acellular Dermal Matrix - Flex HD 6 x 16 cm two  INDICATIONS FOR PROCEDURE:  The patient, Tina Monroe, is a 48 y.o. female born on 1970-09-01, is here for breast reconstruction with placement of bilateral tissue expander and Acellular dermal matrix. MRN: [id number]  CONSENT:  Informed consent was obtained directly from the patient. Risks, benefits and alternatives were fully discussed. Specific risks including but not limited to bleeding, infection, hematoma, seroma, scarring, pain, implant infection, implant extrusion, capsular contracture, asymmetry, wound healing problems, and need for further surgery were all discussed. The patient did have an ample opportunity to have her questions answered to her satisfaction.   DESCRIPTION OF PROCEDURE:  The patient was taken to the operating room. SCDs were placed and IV antibiotics were given. The patient's chest was prepped and draped in a sterile fashion. A time out was performed and the implants to be used were identified.    Right:  The previous incision was utilized.  Local was injected for intraoperative hemostasis and post operative pain control.  The #10 blade was used to incise the previous  incision.  The bovie was used to dissect to the pectoralis muscle.  The pectoralis major muscle was lifted from the chest wall with release of the lateral edge and lateral inframammary fold.  The pocket was irrigated with antibiotic solution and hemostasis was achieved with electrocautery.  The ADM was then prepared according to the manufacture guidelines and slits placed to help with postoperative fluid management.  The ADM was then sutured to the inferior and lateral edge of the inframammary fold with 2-0 PDS starting with an interrupted stitch and then a running stitch.  The lateral portion was sutured to with interrupted sutures after the expander was placed.  The expander was prepared according to the manufacture guidelines, the air evacuated and then it was placed under the ADM and pectoralis major muscle.  The inferior and lateral tabs were used to secure the expander to the chest wall with 2-0 PDS.  The drain was placed at the inframammary fold over the ADM and secured to the skin with 3-0 Silk.  The deep layers were closed with 3-0 Monocryl followed by 4-0 Monocryl.  The skin was closed with 5-0 Monocryl and then dermabond was applied.    Left; The previous incision was utilized.  Local was injected for intraoperative hemostasis and post operative pain control.  The #10 blade was used to incise the previous incision.  The bovie was used to dissect to the pectoralis muscle. The pectoralis major muscle was lifted from the chest wall with release of the lateral edge and lateral inframammary fold.  The pocket was irrigated with antibiotic solution and hemostasis was achieved with electrocautery.  The ADM was then prepared according to the  manufacture guidelines and slits placed to help with postoperative fluid management.  The ADM was then sutured to the inferior and lateral edge of the inframammary fold with 2-0 PDS starting with an interrupted stitch and then a running stitch.  The lateral portion was  sutured to with interrupted sutures after the expander was placed.  The expander was prepared according to the manufacture guidelines, the air evacuated and then it was placed under the ADM and pectoralis major muscle.  The inferior and lateral tabs were used to secure the expander to the chest wall with 2-0 PDS.  The drain was placed at the inframammary fold over the ADM and secured to the skin with 3-0 Silk.    The deep layers were closed with 3-0 Monocryl followed by 4-0 Monocryl.  The skin was closed with 5-0 Monocryl and then dermabond was applied.  The ABDs and breast binder were placed.  The patient tolerated the procedure well and there were no complications.  The patient was allowed to wake from anesthesia and taken to the recovery room in satisfactory condition.   The advanced practice practitioner (APP) assisted throughout the case.  The APP was essential in retraction and counter traction when needed to make the case progress smoothly.  This retraction and assistance made it possible to see the tissue plans for the procedure.  The assistance was needed for blood control, tissue re-approximation and assisted with closure of the incision site.

## 2018-10-29 ENCOUNTER — Encounter (HOSPITAL_BASED_OUTPATIENT_CLINIC_OR_DEPARTMENT_OTHER): Payer: Self-pay | Admitting: Plastic Surgery

## 2018-10-30 ENCOUNTER — Telehealth: Payer: Self-pay

## 2018-10-30 NOTE — Telephone Encounter (Signed)

## 2018-10-31 ENCOUNTER — Encounter: Payer: Self-pay | Admitting: Plastic Surgery

## 2018-10-31 ENCOUNTER — Other Ambulatory Visit: Payer: Self-pay

## 2018-10-31 ENCOUNTER — Ambulatory Visit (INDEPENDENT_AMBULATORY_CARE_PROVIDER_SITE_OTHER): Payer: BC Managed Care – PPO | Admitting: Plastic Surgery

## 2018-10-31 VITALS — BP 135/82 | HR 74 | Temp 97.5°F | Ht 63.0 in | Wt 170.0 lb

## 2018-10-31 DIAGNOSIS — Z9013 Acquired absence of bilateral breasts and nipples: Secondary | ICD-10-CM

## 2018-10-31 DIAGNOSIS — Z170 Estrogen receptor positive status [ER+]: Secondary | ICD-10-CM

## 2018-10-31 DIAGNOSIS — C50511 Malignant neoplasm of lower-outer quadrant of right female breast: Secondary | ICD-10-CM

## 2018-10-31 NOTE — Progress Notes (Signed)
   Subjective:    Patient ID: Tina Monroe, female    DOB: 1970-05-13, 48 y.o.   MRN: [id number]  The patient is a 49 year old female who underwent bilateral mastectomies with reconstruction last week.  Overall she is doing very well.  She had a little bit of constipation which has been her drain output has been minimal the incisions are healing well.  No sign of infection or seroma.     Review of Systems  Constitutional: Negative.   HENT: Negative.   Respiratory: Negative.   Cardiovascular: Negative.   Gastrointestinal: Negative.   Endocrine: Negative.   Genitourinary: Negative.   Hematological: Negative.   Psychiatric/Behavioral: Negative.        Objective:   Physical Exam Vitals signs and nursing note reviewed.  Constitutional:      Appearance: Normal appearance.  HENT:     Head: Normocephalic and atraumatic.  Cardiovascular:     Rate and Rhythm: Normal rate.  Pulmonary:     Effort: Pulmonary effort is normal.  Neurological:     General: No focal deficit present.     Mental Status: She is alert.  Psychiatric:        Mood and Affect: Mood normal.        Behavior: Behavior normal.        Assessment & Plan:     ICD-10-CM   1. History of bilateral mastectomy  Z90.13   2. Malignant neoplasm of lower-outer quadrant of right breast of female, estrogen receptor positive ([place])  C50.511    Z17.0   Continue drain care. Plan to remove the drain next week.  We placed injectable saline in the Expander using a sterile technique: Right: 100 cc for a total of 300 / 535 cc Left: 100 cc for a total of 300 / 535 cc

## 2018-11-03 ENCOUNTER — Encounter (HOSPITAL_BASED_OUTPATIENT_CLINIC_OR_DEPARTMENT_OTHER): Payer: Self-pay | Admitting: Plastic Surgery

## 2018-11-06 ENCOUNTER — Telehealth: Payer: Self-pay

## 2018-11-06 NOTE — Telephone Encounter (Signed)

## 2018-11-07 ENCOUNTER — Other Ambulatory Visit: Payer: Self-pay

## 2018-11-07 ENCOUNTER — Ambulatory Visit (INDEPENDENT_AMBULATORY_CARE_PROVIDER_SITE_OTHER): Payer: BC Managed Care – PPO | Admitting: Surgical

## 2018-11-07 ENCOUNTER — Encounter: Payer: Self-pay | Admitting: Surgical

## 2018-11-07 VITALS — BP 120/83 | HR 75 | Temp 97.7°F | Ht 63.0 in | Wt 174.2 lb

## 2018-11-07 DIAGNOSIS — Z170 Estrogen receptor positive status [ER+]: Secondary | ICD-10-CM

## 2018-11-07 DIAGNOSIS — C50511 Malignant neoplasm of lower-outer quadrant of right female breast: Secondary | ICD-10-CM

## 2018-11-07 DIAGNOSIS — Z9013 Acquired absence of bilateral breasts and nipples: Secondary | ICD-10-CM

## 2018-11-07 NOTE — Progress Notes (Addendum)
   Subjective:     Patient ID: Tina Monroe, female    DOB: [birth year], 48 y.o.   MRN: [id number]  Chief Complaint  Patient presents with  . Follow-up    1 week remove drains    HPI: The patient is a 48 y.o. female here for follow-up after bilateral breast reconstruction on October 23, 2018 with Dr. Marla Roe.  No history of radiation.  She is doing well after her last fill on 10/31/18. She has no complaints today. No fever, chills, n/v.  Incisions healing really well. JP drains with minimal serosanguinous output.  She is back to work as an Microbiologist.   Review of Systems  Constitutional: Negative for chills, fever and malaise/fatigue.  Respiratory: Negative.   Cardiovascular: Negative.   Genitourinary: Negative.   Musculoskeletal: Positive for myalgias.  Skin: Negative for itching and rash.  Neurological: Negative for dizziness, focal weakness, weakness and headaches.  Psychiatric/Behavioral: Negative.      Objective:   Vital Signs BP 120/83 (BP Location: Left Arm, Patient Position: Sitting, Cuff Size: Normal)   Pulse 75   Temp 97.7 F (36.5 C) (Temporal)   Ht 5\' 3"  (1.6 m)   Wt 174 lb 3.2 oz (79 kg)   SpO2 100%   BMI 30.86 kg/m  Vital Signs and Nursing Note Reviewed Chaperone present Physical Exam  Constitutional: She is oriented to person, place, and time and well-developed, well-nourished, and in no distress.  HENT:  Head: Normocephalic and atraumatic.  Cardiovascular: Normal rate.  Pulmonary/Chest: Effort normal.    Right breast expander is slightly high-riding   Musculoskeletal: Normal range of motion.  Neurological: She is alert and oriented to person, place, and time. Gait normal.  Skin: Skin is warm and dry. No rash noted. She is not diaphoretic. No erythema. No pallor.  Psychiatric: Mood and affect normal.    Assessment/Plan:     ICD-10-CM   1. History of bilateral mastectomy  Z90.13   2. Malignant neoplasm of lower-outer quadrant  of right breast of female, estrogen receptor positive ([place])  C50.511    Z17.0     Tina Monroe is doing well. No complaints after last fill. Incisions are c/d/i. No fever, chill, n/v.  Patient has valium at home, recommend taking tonight to assist with muscle relaxation and allow implant to settle.  Jp drains removed - she can shower in 24 hours.  We placed injectable saline in the Expander using a sterile technique: Right: 50 cc for a total of 350 / 535 cc Left: 50 cc for a total of 350 / 535 cc  Follow up in 2 weeks.  Carola Rhine Tina [place]isco, PA-C 11/07/2018, 8:51 AM

## 2018-11-20 ENCOUNTER — Telehealth: Payer: Self-pay

## 2018-11-20 NOTE — Progress Notes (Signed)
   Subjective:     Patient ID: Tina Monroe, female    DOB: 04/03/1970, 48 y.o.   MRN: [id number]  Chief Complaint  Patient presents with  . Follow-up    2 weeks for (B) breast reconstruction    HPI: The patient is a 48 y.o. female here for follow-up after bilateral breast reconstruction on 10/23/2018.  She is 1 month postop  She is doing really well.  Her incisions are healing really nicely.  She reports that she tolerated last fill fine and Valium helped relieve any tightness/pain.  After today's fill she believes she would be at her desired size.  No fever, chills, nausea, vomiting.  Incisions are C/D/I.  She has a slight peri-incisional area of irritation on the right breast, but this is not itchy or causing her any issues.  No surrounding erythema,    Review of Systems  Constitutional: Negative for chills, diaphoresis, fever, malaise/fatigue and weight loss.  Respiratory: Negative.   Cardiovascular: Negative for chest pain, palpitations, orthopnea, claudication and leg swelling.  Gastrointestinal: Negative.   Genitourinary: Negative.   Musculoskeletal: Positive for myalgias (Breast).  Skin: Positive for rash (Irritation). Negative for itching.  Neurological: Negative.      Objective:   Vital Signs BP 115/78 (BP Location: Left Arm, Patient Position: Sitting, Cuff Size: Normal)   Pulse 63   Temp (!) 97.5 F (36.4 C) (Temporal)   Ht 5\' 3"  (1.6 m)   Wt 176 lb 6.4 oz (80 kg)   SpO2 100%   BMI 31.25 kg/m  Vital Signs and Nursing Note Reviewed Chaperone present Physical Exam  Constitutional: She is oriented to person, place, and time and well-developed, well-nourished, and in no distress.  HENT:  Head: Normocephalic and atraumatic.  Cardiovascular: Normal rate.  Pulmonary/Chest: Effort normal.    Musculoskeletal: Normal range of motion.  Neurological: She is alert and oriented to person, place, and time. Gait normal.  Skin: Skin is warm and dry. No rash noted.  She is not diaphoretic. No erythema. No pallor.  Psychiatric: Mood and affect normal.      Assessment/Plan:     ICD-10-CM   1. History of bilateral mastectomy  Z90.13   2. Malignant neoplasm of lower-outer quadrant of right breast of female, estrogen receptor positive ([place])  C50.511    Z17.0     Overall she is doing really well.  She is healing well.  No fever, chills,nausea, vomiting.  No sign of hematoma/seroma/infection.  We placed injectable saline in the Expander using a sterile technique: Right:50cc for a total of 400/ 535cc Left:50cc for a total of 400/ 535cc  Follow up in 2 weeks for evaluation.  She believes she is at the current size that she desires.  Follow-up with Dr. Marla Roe in 2 weeks.  Earliest exchange from expander to implant would be December to allow ADM to incorporate.   Tina Rhine Laila Myhre, PA-C 11/21/2018, 8:24 AM

## 2018-11-20 NOTE — Telephone Encounter (Signed)

## 2018-11-21 ENCOUNTER — Encounter: Payer: Self-pay | Admitting: Surgical

## 2018-11-21 ENCOUNTER — Ambulatory Visit (INDEPENDENT_AMBULATORY_CARE_PROVIDER_SITE_OTHER): Payer: BC Managed Care – PPO | Admitting: Surgical

## 2018-11-21 ENCOUNTER — Other Ambulatory Visit: Payer: Self-pay

## 2018-11-21 VITALS — BP 115/78 | HR 63 | Temp 97.5°F | Ht 63.0 in | Wt 176.4 lb

## 2018-11-21 DIAGNOSIS — Z170 Estrogen receptor positive status [ER+]: Secondary | ICD-10-CM

## 2018-11-21 DIAGNOSIS — Z9013 Acquired absence of bilateral breasts and nipples: Secondary | ICD-10-CM

## 2018-11-21 DIAGNOSIS — C50511 Malignant neoplasm of lower-outer quadrant of right female breast: Secondary | ICD-10-CM

## 2018-12-04 ENCOUNTER — Telehealth: Payer: Self-pay

## 2018-12-04 NOTE — Telephone Encounter (Signed)

## 2018-12-05 ENCOUNTER — Other Ambulatory Visit: Payer: Self-pay

## 2018-12-05 ENCOUNTER — Encounter: Payer: Self-pay | Admitting: Plastic Surgery

## 2018-12-05 ENCOUNTER — Ambulatory Visit (INDEPENDENT_AMBULATORY_CARE_PROVIDER_SITE_OTHER): Payer: BC Managed Care – PPO | Admitting: Plastic Surgery

## 2018-12-05 VITALS — BP 122/64 | HR 61 | Temp 97.5°F | Ht 63.0 in | Wt 175.8 lb

## 2018-12-05 DIAGNOSIS — C50511 Malignant neoplasm of lower-outer quadrant of right female breast: Secondary | ICD-10-CM

## 2018-12-05 DIAGNOSIS — Z170 Estrogen receptor positive status [ER+]: Secondary | ICD-10-CM

## 2018-12-05 DIAGNOSIS — Z9013 Acquired absence of bilateral breasts and nipples: Secondary | ICD-10-CM

## 2018-12-05 NOTE — Progress Notes (Signed)
   Subjective:    Patient ID: Tina Monroe, female    DOB: 1971-01-21, 48 y.o.   MRN: [id number]  The patient is a 48 yrs old wf here for follow up on her breast reconstruction.  She underwent mastectomies with first stage reconstruction in early September.  She is tolerating fills well to the expanders.  Her incisions are well healed.  She would like to be a little bit bigger.   Review of Systems  Constitutional: Negative.   HENT: Negative.   Eyes: Negative.   Respiratory: Negative.   Cardiovascular: Negative.   Gastrointestinal: Negative.   Genitourinary: Negative.   Musculoskeletal: Negative.        Objective:   Physical Exam Vitals signs and nursing note reviewed.  Constitutional:      Appearance: Normal appearance.  HENT:     Head: Normocephalic and atraumatic.  Cardiovascular:     Rate and Rhythm: Normal rate.     Pulses: Normal pulses.  Pulmonary:     Effort: Pulmonary effort is normal.  Neurological:     General: No focal deficit present.     Mental Status: She is alert and oriented to person, place, and time.  Psychiatric:        Mood and Affect: Mood normal.        Behavior: Behavior normal.         Assessment & Plan:     ICD-10-CM   1. History of bilateral mastectomy  Z90.13   2. Malignant neoplasm of lower-outer quadrant of right breast of female, estrogen receptor positive ([place])  C50.511    Z17.0     We placed injectable saline in the Expander using a sterile technique: Right: 50 cc for a total of 450 / 535 cc Left: 50 cc for a total of 450 / 535 cc Plan for removal of bilateral expanders and placement of implants. Pictures were obtained of the patient and placed in the chart with the patient's or guardian's permission.

## 2018-12-12 DIAGNOSIS — Z79810 Long term (current) use of selective estrogen receptor modulators (SERMs): Secondary | ICD-10-CM

## 2018-12-12 DIAGNOSIS — Z170 Estrogen receptor positive status [ER+]: Secondary | ICD-10-CM

## 2018-12-12 DIAGNOSIS — C50511 Malignant neoplasm of lower-outer quadrant of right female breast: Secondary | ICD-10-CM

## 2018-12-26 ENCOUNTER — Other Ambulatory Visit: Payer: Self-pay

## 2018-12-26 ENCOUNTER — Encounter: Payer: Self-pay | Admitting: Surgical

## 2018-12-26 ENCOUNTER — Ambulatory Visit (INDEPENDENT_AMBULATORY_CARE_PROVIDER_SITE_OTHER): Payer: BC Managed Care – PPO | Admitting: Surgical

## 2018-12-26 VITALS — BP 138/84 | HR 71 | Temp 97.5°F | Ht 63.0 in | Wt 174.6 lb

## 2018-12-26 DIAGNOSIS — C50511 Malignant neoplasm of lower-outer quadrant of right female breast: Secondary | ICD-10-CM

## 2018-12-26 DIAGNOSIS — Z9013 Acquired absence of bilateral breasts and nipples: Secondary | ICD-10-CM

## 2018-12-26 DIAGNOSIS — Z170 Estrogen receptor positive status [ER+]: Secondary | ICD-10-CM

## 2018-12-26 MED ORDER — ONDANSETRON HCL 4 MG PO TABS: 4.0000 mg | ORAL_TABLET | Freq: Three times a day (TID) | ORAL | 0 refills | Status: DC | PRN

## 2018-12-26 MED ORDER — HYDROCODONE-ACETAMINOPHEN 5-325 MG PO TABS: 1.0000 | ORAL_TABLET | Freq: Four times a day (QID) | ORAL | 0 refills | Status: AC | PRN

## 2018-12-26 MED ORDER — CEPHALEXIN 500 MG PO CAPS: 500.0000 mg | ORAL_CAPSULE | Freq: Four times a day (QID) | ORAL | 0 refills | Status: AC

## 2018-12-26 NOTE — H&P (View-Only) (Signed)
   Patient ID: Tina Monroe, female    DOB: 12/04/1970, 48 y.o.   MRN: [id number]  Chief Complaint  Patient presents with  . Pre-op Exam    Removal of (B) breast expanders and placement of implants      ICD-10-CM   1. Malignant neoplasm of lower-outer quadrant of right breast of female, estrogen receptor positive (H[facility])  C50.511    Z17.0   2. History of bilateral mastectomy  Z90.13    History of Present Illness: Tina Monroe Peral is a 48 y.o.  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  She presents for preoperative evaluation for upcoming procedure, removal of bilateral breast expanders with placement of implants, scheduled for 01/22/19 with Dr. Dillingham.  The patient has not had problems with anesthesia. She is generally healthy. She has not had any recent colds or fevers. She does not take any anticoagulants.   She is currently taking tamoxifen and ibuprofen PRN.   She would like one more fill.  Past Medical History: Allergies: Allergies  Allergen Reactions  . Dilaudid [Hydromorphone Hcl] Itching    Current Medications:  Current Outpatient Medications:  .  ibuprofen (ADVIL) 800 MG tablet, TAKE 1 TABLET BY MOUTH EVERY 6 TO 8 HOURS AS NEEDED FOR PAIN, Disp: , Rfl:  .  tamoxifen (NOLVADEX) 20 MG tablet, Take 20 mg by mouth daily., Disp: , Rfl:   Past Medical Problems: Past Medical History:  Diagnosis Date  . Cancer (HCC)    breast cancer    Past Surgical History: Past Surgical History:  Procedure Laterality Date  . ABDOMINAL HYSTERECTOMY  09/25/2018   partial this year, other partial in 2003  . BREAST RECONSTRUCTION WITH PLACEMENT OF TISSUE EXPANDER AND FLEX HD (ACELLULAR HYDRATED DERMIS) Bilateral 10/23/2018   Procedure: BREAST RECONSTRUCTION WITH PLACEMENT OF TISSUE EXPANDER AND FLEX HD (ACELLULAR HYDRATED DERMIS);  Surgeon: Dillingham, Claire S, DO;  Location: [facility];  Service: Plastics;  Laterality: Bilateral;  2.5 hours  . CARDIAC CATHETERIZATION     post chemo, everything normal  . MASTECTOMY  02/2018  . MENISCUS REPAIR  2000  . PORTA CATH INSERTION  02/2018  . PORTA CATH REMOVAL      Social History: Social History   Socioeconomic History  . Marital status: Married    Spouse name: Not on file  . Number of children: Not on file  . Years of education: Not on file  . Highest education level: Not on file  Occupational History  . Not on file  Social Needs  . Financial resource strain: Not on file  . Food insecurity    Worry: Not on file    Inability: Not on file  . Transportation needs    Medical: Not on file    Non-medical: Not on file  Tobacco Use  . Smoking status: Never Smoker  . Smokeless tobacco: Never Used  Substance and Sexual Activity  . Alcohol use: Not Currently  . Drug use: No  . Sexual activity: Not on file  Lifestyle  . Physical activity    Days per week: Not on file    Minutes per session: Not on file  . Stress: Not on file  Relationships  . Social connections    Talks on phone: Not on file    Gets together: Not on file    Attends religious service: Not on file      Active member of club or organization: Not on file    Attends meetings of clubs or organizations: Not on file    Relationship status: Not on file  . Intimate partner violence    Fear of current or ex partner: Not on file    Emotionally abused: Not on file    Physically abused: Not on file    Forced sexual activity: Not on file  Other Topics Concern  . Not on file  Social History Narrative  . Not on file    Family History: No family history on file.  Review of Systems: Review of Systems  Constitutional: Negative for chills, diaphoresis, fever, malaise/fatigue and weight loss.  Respiratory: Negative.   Cardiovascular: Negative.   Gastrointestinal: Negative.   Genitourinary: Negative.   Musculoskeletal: Positive for  myalgias.  Skin: Negative for itching and rash.  Neurological: Negative.     Physical Exam: Vital Signs BP 138/84 (BP Location: Left Arm, Patient Position: Sitting, Cuff Size: Normal)   Pulse 71   Temp (!) 97.5 F (36.4 C) (Temporal)   Ht 5' 3" (1.6 m)   Wt 174 lb 9.6 oz (79.2 kg)   SpO2 97%   BMI 30.93 kg/m  Physical Exam Exam conducted with a chaperone present.  Constitutional:      General: She is not in acute distress.    Appearance: Normal appearance. She is not ill-appearing.  HENT:     Head: Normocephalic and atraumatic.  Eyes:     Pupils: Pupils are equal, round Neck:     Musculoskeletal: Normal range of motion.  Cardiovascular:     Rate and Rhythm: Normal rate and regular rhythm.     Pulses: Normal pulses.     Heart sounds: Normal heart sounds. No murmur.  Pulmonary:     Effort: Pulmonary effort is normal. No respiratory distress.     Breath sounds: Normal breath sounds. No wheezing.  Breast: Transverse mastectomy incisions. Well healed, bilaterally. Abdominal:     General: Abdomen is flat. There is no distension.     Palpations: Abdomen is soft.     Tenderness: There is no abdominal tenderness.  Musculoskeletal: Normal range of motion.  Skin:    General: Skin is warm and dry.     Findings: No erythema or rash.  Neurological:     General: No focal deficit present.     Mental Status: She is alert and oriented to person, place, and time. Mental status is at baseline.     Motor: No weakness.  Psychiatric:        Mood and Affect: Mood normal.        Behavior: Behavior normal.    Assessment/Plan: The patient is scheduled for removal of bilateral breast expanders with placement of implants with Dr. Dillingham on 01/22/19.  Risks, benefits, and alternatives of procedure discussed, questions answered and consent obtained.    We placed injectable saline in the Expander using a sterile technique: Right: 50 cc for a total of 500 / 535 cc Left: 0 cc for a total of  450 / 535 cc  She is happy with her size at this time, she understands limitations due to the tightness of her right breast skin. She feels more symmetrical after todays fill.   Rx sent to pharmacy covid scheduled All questions answered. We covered the consent form booklet, patient verbalized understanding, all questions answered.   Electronically signed by: Rajohn Henery J Malene Blaydes, PA-C 12/26/2018 1:06 PM  

## 2018-12-26 NOTE — Progress Notes (Signed)
   Patient ID: Tina Monroe, female    DOB: 10/29/1970, 48 y.o.   MRN: [id number]  Chief Complaint  Patient presents with  . Pre-op Exam    Removal of (B) breast expanders and placement of implants      ICD-10-CM   1. Malignant neoplasm of lower-outer quadrant of right breast of female, estrogen receptor positive (HCC)  C50.511    Z17.0   2. History of bilateral mastectomy  Z90.13    History of Present Illness: Tina Monroe Gangl is a 48 y.o.  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  She presents for preoperative evaluation for upcoming procedure, removal of bilateral breast expanders with placement of implants, scheduled for 01/22/19 with Dr. Dillingham.  The patient has not had problems with anesthesia. She is generally healthy. She has not had any recent colds or fevers. She does not take any anticoagulants.   She is currently taking tamoxifen and ibuprofen PRN.   She would like one more fill.  Past Medical History: Allergies: Allergies  Allergen Reactions  . Dilaudid [Hydromorphone Hcl] Itching    Current Medications:  Current Outpatient Medications:  .  ibuprofen (ADVIL) 800 MG tablet, TAKE 1 TABLET BY MOUTH EVERY 6 TO 8 HOURS AS NEEDED FOR PAIN, Disp: , Rfl:  .  tamoxifen (NOLVADEX) 20 MG tablet, Take 20 mg by mouth daily., Disp: , Rfl:   Past Medical Problems: Past Medical History:  Diagnosis Date  . Cancer (HCC)    breast cancer    Past Surgical History: Past Surgical History:  Procedure Laterality Date  . ABDOMINAL HYSTERECTOMY  09/25/2018   partial this year, other partial in 2003  . BREAST RECONSTRUCTION WITH PLACEMENT OF TISSUE EXPANDER AND FLEX HD (ACELLULAR HYDRATED DERMIS) Bilateral 10/23/2018   Procedure: BREAST RECONSTRUCTION WITH PLACEMENT OF TISSUE EXPANDER AND FLEX HD (ACELLULAR HYDRATED DERMIS);  Surgeon: Dillingham, Claire S, DO;  Location: [facility]  [place];  Service: Plastics;  Laterality: Bilateral;  2.5 hours  . CARDIAC CATHETERIZATION     post chemo, everything normal  . MASTECTOMY  02/2018  . MENISCUS REPAIR  2000  . PORTA CATH INSERTION  02/2018  . PORTA CATH REMOVAL      Social History: Social History   Socioeconomic History  . Marital status: Married    Spouse name: Not on file  . Number of children: Not on file  . Years of education: Not on file  . Highest education level: Not on file  Occupational History  . Not on file  Social Needs  . Financial resource strain: Not on file  . Food insecurity    Worry: Not on file    Inability: Not on file  . Transportation needs    Medical: Not on file    Non-medical: Not on file  Tobacco Use  . Smoking status: Never Smoker  . Smokeless tobacco: Never Used  Substance and Sexual Activity  . Alcohol use: Not Currently  . Drug use: No  . Sexual activity: Not on file  Lifestyle  . Physical activity    Days per week: Not on file    Minutes per session: Not on file  . Stress: Not on file  Relationships  . Social connections    Talks on phone: Not on file    Gets together: Not on file    Attends religious service: Not on file      Active member of club or organization: Not on file    Attends meetings of clubs or organizations: Not on file    Relationship status: Not on file  . Intimate partner violence    Fear of current or ex partner: Not on file    Emotionally abused: Not on file    Physically abused: Not on file    Forced sexual activity: Not on file  Other Topics Concern  . Not on file  Social History Narrative  . Not on file    Family History: No family history on file.  Review of Systems: Review of Systems  Constitutional: Negative for chills, diaphoresis, fever, malaise/fatigue and weight loss.  Respiratory: Negative.   Cardiovascular: Negative.   Gastrointestinal: Negative.   Genitourinary: Negative.   Musculoskeletal: Positive for  myalgias.  Skin: Negative for itching and rash.  Neurological: Negative.     Physical Exam: Vital Signs BP 138/84 (BP Location: Left Arm, Patient Position: Sitting, Cuff Size: Normal)   Pulse 71   Temp (!) 97.5 F (36.4 C) (Temporal)   Ht 5' 3" (1.6 m)   Wt 174 lb 9.6 oz (79.2 kg)   SpO2 97%   BMI 30.93 kg/m  Physical Exam Exam conducted with a chaperone present.  Constitutional:      General: She is not in acute distress.    Appearance: Normal appearance. She is not ill-appearing.  HENT:     Head: Normocephalic and atraumatic.  Eyes:     Pupils: Pupils are equal, round Neck:     Musculoskeletal: Normal range of motion.  Cardiovascular:     Rate and Rhythm: Normal rate and regular rhythm.     Pulses: Normal pulses.     Heart sounds: Normal heart sounds. No murmur.  Pulmonary:     Effort: Pulmonary effort is normal. No respiratory distress.     Breath sounds: Normal breath sounds. No wheezing.  Breast: Transverse mastectomy incisions. Well healed, bilaterally. Abdominal:     General: Abdomen is flat. There is no distension.     Palpations: Abdomen is soft.     Tenderness: There is no abdominal tenderness.  Musculoskeletal: Normal range of motion.  Skin:    General: Skin is warm and dry.     Findings: No erythema or rash.  Neurological:     General: No focal deficit present.     Mental Status: She is alert and oriented to person, place, and time. Mental status is at baseline.     Motor: No weakness.  Psychiatric:        Mood and Affect: Mood normal.        Behavior: Behavior normal.    Assessment/Plan: The patient is scheduled for removal of bilateral breast expanders with placement of implants with Dr. Dillingham on 01/22/19.  Risks, benefits, and alternatives of procedure discussed, questions answered and consent obtained.    We placed injectable saline in the Expander using a sterile technique: Right: 50 cc for a total of 500 / 535 cc Left: 0 cc for a total of  450 / 535 cc  She is happy with her size at this time, she understands limitations due to the tightness of her right breast skin. She feels more symmetrical after todays fill.   Rx sent to pharmacy covid scheduled All questions answered. We covered the consent form booklet, patient verbalized understanding, all questions answered.   Electronically signed by: Matthew J Scheeler, PA-C 12/26/2018 1:06 PM  

## 2019-01-09 ENCOUNTER — Encounter: Payer: Self-pay | Admitting: Surgical

## 2019-01-09 ENCOUNTER — Other Ambulatory Visit: Payer: Self-pay

## 2019-01-09 ENCOUNTER — Ambulatory Visit (INDEPENDENT_AMBULATORY_CARE_PROVIDER_SITE_OTHER): Payer: BC Managed Care – PPO | Admitting: Surgical

## 2019-01-09 VITALS — BP 131/85 | HR 57 | Temp 97.5°F | Ht 63.0 in | Wt 177.8 lb

## 2019-01-09 DIAGNOSIS — Z9889 Other specified postprocedural states: Secondary | ICD-10-CM

## 2019-01-09 DIAGNOSIS — C50511 Malignant neoplasm of lower-outer quadrant of right female breast: Secondary | ICD-10-CM

## 2019-01-09 DIAGNOSIS — Z170 Estrogen receptor positive status [ER+]: Secondary | ICD-10-CM

## 2019-01-09 DIAGNOSIS — Z9013 Acquired absence of bilateral breasts and nipples: Secondary | ICD-10-CM

## 2019-01-09 NOTE — Progress Notes (Cosign Needed)
   Subjective:     Patient ID: Tina Monroe, female    DOB: [birth year], 48 y.o.   MRN: [id number]  Chief Complaint  Patient presents with  . Follow-up    2 weeks    HPI: The patient is a 48 y.o. female here for follow-up on her bilateral breast reconstruction.  She is here for one additional fill prior to exchange surgery. She is very pleased with her size and her symmetry.  She has no complaints. She is scheduled for exchanged on 01/22/19.  Review of Systems  Constitutional: Positive for activity change. Negative for chills, diaphoresis, fatigue and fever.  Respiratory: Negative.   Cardiovascular: Negative.   Gastrointestinal: Negative.   Genitourinary: Negative.   Musculoskeletal: Positive for myalgias.  Skin: Negative for color change, pallor, rash and wound.  Neurological: Negative.      Objective:   Vital Signs BP 131/85 (BP Location: Left Arm, Patient Position: Sitting, Cuff Size: Normal)   Pulse (!) 57   Temp (!) 97.5 F (36.4 C) (Temporal)   Ht 5\' 3"  (1.6 m)   Wt 177 lb 12.8 oz (80.6 kg)   SpO2 100%   BMI 31.50 kg/m  Vital Signs and Nursing Note Reviewed  Physical Exam  Constitutional: She is oriented to person, place, and time and well-developed, well-nourished, and in no distress.  HENT:  Head: Normocephalic and atraumatic.  Cardiovascular: Normal rate.  Pulmonary/Chest: Effort normal.  Musculoskeletal: Normal range of motion.  Neurological: She is alert and oriented to person, place, and time. Gait normal.  Skin: Skin is warm and dry. No rash noted. She is not diaphoretic. No erythema. No pallor.  Psychiatric: Mood and affect normal.    Assessment/Plan:     ICD-10-CM   1. S/P breast reconstruction  Z98.890   2. Malignant neoplasm of lower-outer quadrant of right breast of female, estrogen receptor positive ([place])  C50.511    Z17.0   3. History of bilateral mastectomy  Z90.13      Tina Monroe is doing well. She is very pleased with  her size.   We placed injectable saline in the Expander using a sterile technique: Right: 40 cc for a total of 540 / 535 cc Left: 40 cc for a total of 490 / 535 cc  Plan for surgery 01/22/19 for exchange.  Carola Rhine Maela Takeda, PA-C 01/09/2019, 10:47 AM

## 2019-01-12 ENCOUNTER — Other Ambulatory Visit: Payer: Self-pay

## 2019-01-12 ENCOUNTER — Encounter (HOSPITAL_BASED_OUTPATIENT_CLINIC_OR_DEPARTMENT_OTHER): Payer: Self-pay | Admitting: *Deleted

## 2019-01-19 ENCOUNTER — Other Ambulatory Visit (HOSPITAL_COMMUNITY)
Admission: RE | Admit: 2019-01-19 | Discharge: 2019-01-19 | Disposition: A | Discharge status: Home / Self Care | Payer: BC Managed Care – PPO | Source: Ambulatory Visit | Attending: Plastic Surgery | Admitting: Plastic Surgery

## 2019-01-19 DIAGNOSIS — Z01812 Encounter for preprocedural laboratory examination: Secondary | ICD-10-CM | POA: Insufficient documentation

## 2019-01-19 DIAGNOSIS — Z20828 Contact with and (suspected) exposure to other viral communicable diseases: Secondary | ICD-10-CM | POA: Diagnosis not present

## 2019-01-20 LAB — NOVEL CORONAVIRUS, NAA (HOSP ORDER, SEND-OUT TO REF LAB; TAT 18-24 HRS): SARS-CoV-2, NAA: NOT DETECTED

## 2019-01-22 ENCOUNTER — Encounter (HOSPITAL_BASED_OUTPATIENT_CLINIC_OR_DEPARTMENT_OTHER)
Admission: RE | Disposition: A | Discharge status: Home / Self Care | Payer: Self-pay | Source: Home / Self Care | Attending: Plastic Surgery

## 2019-01-22 ENCOUNTER — Encounter (HOSPITAL_BASED_OUTPATIENT_CLINIC_OR_DEPARTMENT_OTHER): Payer: Self-pay

## 2019-01-22 ENCOUNTER — Ambulatory Visit (HOSPITAL_BASED_OUTPATIENT_CLINIC_OR_DEPARTMENT_OTHER): Payer: BC Managed Care – PPO | Admitting: Anesthesiology

## 2019-01-22 ENCOUNTER — Other Ambulatory Visit: Payer: Self-pay

## 2019-01-22 ENCOUNTER — Ambulatory Visit (HOSPITAL_BASED_OUTPATIENT_CLINIC_OR_DEPARTMENT_OTHER)
Admission: RE | Admit: 2019-01-22 | Discharge: 2019-01-22 | Disposition: A | Discharge status: Home / Self Care | Payer: BC Managed Care – PPO | Attending: Plastic Surgery | Admitting: Plastic Surgery

## 2019-01-22 DIAGNOSIS — Z9013 Acquired absence of bilateral breasts and nipples: Secondary | ICD-10-CM | POA: Insufficient documentation

## 2019-01-22 DIAGNOSIS — Z885 Allergy status to narcotic agent status: Secondary | ICD-10-CM | POA: Diagnosis not present

## 2019-01-22 DIAGNOSIS — Z791 Long term (current) use of non-steroidal anti-inflammatories (NSAID): Secondary | ICD-10-CM | POA: Diagnosis not present

## 2019-01-22 DIAGNOSIS — Z79810 Long term (current) use of selective estrogen receptor modulators (SERMs): Secondary | ICD-10-CM | POA: Insufficient documentation

## 2019-01-22 DIAGNOSIS — Z853 Personal history of malignant neoplasm of breast: Secondary | ICD-10-CM | POA: Insufficient documentation

## 2019-01-22 DIAGNOSIS — C50511 Malignant neoplasm of lower-outer quadrant of right female breast: Secondary | ICD-10-CM | POA: Diagnosis not present

## 2019-01-22 DIAGNOSIS — Z170 Estrogen receptor positive status [ER+]: Secondary | ICD-10-CM | POA: Diagnosis not present

## 2019-01-22 HISTORY — PX: REMOVAL OF BILATERAL TISSUE EXPANDERS WITH PLACEMENT OF BILATERAL BREAST IMPLANTS: SHX6431

## 2019-01-22 SURGERY — REMOVAL, TISSUE EXPANDER, BREAST, BILATERAL, WITH BILATERAL IMPLANT IMPLANT INSERTION
Anesthesia: General | Site: Breast | Laterality: Bilateral

## 2019-01-22 MED ORDER — MEPERIDINE HCL 25 MG/ML IJ SOLN: 6.2500 mg | INTRAMUSCULAR | Status: DC | PRN

## 2019-01-22 MED ORDER — SODIUM CHLORIDE 0.9% FLUSH: 3.0000 mL | INTRAVENOUS | Status: DC | PRN

## 2019-01-22 MED ORDER — PROMETHAZINE HCL 25 MG/ML IJ SOLN: 6.2500 mg | INTRAMUSCULAR | Status: DC | PRN

## 2019-01-22 MED ORDER — FENTANYL CITRATE (PF) 100 MCG/2ML IJ SOLN
INTRAMUSCULAR | Status: AC
  Filled 2019-01-22: qty 2

## 2019-01-22 MED ORDER — ACETAMINOPHEN 500 MG PO TABS: 1000.0000 mg | ORAL_TABLET | Freq: Once | ORAL | Status: DC

## 2019-01-22 MED ORDER — ACETAMINOPHEN 325 MG PO TABS: 650.0000 mg | ORAL_TABLET | ORAL | Status: DC | PRN

## 2019-01-22 MED ORDER — ROCURONIUM BROMIDE 100 MG/10ML IV SOLN
INTRAVENOUS | Status: DC | PRN
  Administered 2019-01-22: 50 mg via INTRAVENOUS

## 2019-01-22 MED ORDER — EPINEPHRINE PF 1 MG/ML IJ SOLN
INTRAMUSCULAR | Status: AC
  Filled 2019-01-22: qty 1

## 2019-01-22 MED ORDER — SODIUM CHLORIDE 0.9% FLUSH: 3.0000 mL | Freq: Two times a day (BID) | INTRAVENOUS | Status: DC

## 2019-01-22 MED ORDER — PROPOFOL 10 MG/ML IV BOLUS
INTRAVENOUS | Status: AC
  Filled 2019-01-22: qty 40

## 2019-01-22 MED ORDER — SODIUM CHLORIDE (PF) 0.9 % IJ SOLN
INTRAMUSCULAR | Status: AC
  Filled 2019-01-22: qty 10

## 2019-01-22 MED ORDER — OXYCODONE HCL 5 MG/5ML PO SOLN: 5.0000 mg | Freq: Once | ORAL | Status: DC | PRN

## 2019-01-22 MED ORDER — ACETAMINOPHEN 650 MG RE SUPP: 650.0000 mg | RECTAL | Status: DC | PRN

## 2019-01-22 MED ORDER — MIDAZOLAM HCL 2 MG/2ML IJ SOLN
INTRAMUSCULAR | Status: AC
  Filled 2019-01-22: qty 2

## 2019-01-22 MED ORDER — LIDOCAINE 2% (20 MG/ML) 5 ML SYRINGE
INTRAMUSCULAR | Status: AC
  Filled 2019-01-22: qty 5

## 2019-01-22 MED ORDER — OXYCODONE HCL 5 MG PO TABS: 5.0000 mg | ORAL_TABLET | Freq: Once | ORAL | Status: DC | PRN

## 2019-01-22 MED ORDER — SODIUM CHLORIDE 0.9 % IV SOLN
INTRAVENOUS | Status: DC | PRN
  Administered 2019-01-22: 500 mL

## 2019-01-22 MED ORDER — PHENYLEPHRINE HCL (PRESSORS) 10 MG/ML IV SOLN
INTRAVENOUS | Status: DC | PRN
  Administered 2019-01-22 (×2): 80 ug via INTRAVENOUS
  Administered 2019-01-22: 120 ug via INTRAVENOUS
  Administered 2019-01-22 (×2): 80 ug via INTRAVENOUS

## 2019-01-22 MED ORDER — LIDOCAINE HCL (PF) 1 % IJ SOLN
INTRAMUSCULAR | Status: AC
  Filled 2019-01-22: qty 30

## 2019-01-22 MED ORDER — CHLORHEXIDINE GLUCONATE CLOTH 2 % EX PADS: 6.0000 | MEDICATED_PAD | Freq: Once | CUTANEOUS | Status: DC

## 2019-01-22 MED ORDER — ONDANSETRON HCL 4 MG/2ML IJ SOLN
INTRAMUSCULAR | Status: DC | PRN
  Administered 2019-01-22: 4 mg via INTRAVENOUS

## 2019-01-22 MED ORDER — LIDOCAINE-EPINEPHRINE 1 %-1:100000 IJ SOLN
INTRAMUSCULAR | Status: DC | PRN
  Administered 2019-01-22: 10 mL

## 2019-01-22 MED ORDER — SODIUM CHLORIDE 0.9 % IV SOLN: 250.0000 mL | INTRAVENOUS | Status: DC | PRN

## 2019-01-22 MED ORDER — DEXAMETHASONE SODIUM PHOSPHATE 10 MG/ML IJ SOLN
INTRAMUSCULAR | Status: AC
  Filled 2019-01-22: qty 1

## 2019-01-22 MED ORDER — CEFAZOLIN SODIUM-DEXTROSE 2-4 GM/100ML-% IV SOLN: 2.0000 g | INTRAVENOUS | Status: DC

## 2019-01-22 MED ORDER — LACTATED RINGERS IV SOLN
INTRAVENOUS | Status: DC
  Administered 2019-01-22 (×2): via INTRAVENOUS

## 2019-01-22 MED ORDER — LIDOCAINE HCL (CARDIAC) PF 100 MG/5ML IV SOSY
PREFILLED_SYRINGE | INTRAVENOUS | Status: DC | PRN
  Administered 2019-01-22: 60 mg via INTRATRACHEAL
  Administered 2019-01-22: 100 mg via INTRATRACHEAL

## 2019-01-22 MED ORDER — FENTANYL CITRATE (PF) 100 MCG/2ML IJ SOLN
25.0000 ug | INTRAMUSCULAR | Status: DC | PRN
  Administered 2019-01-22: 50 ug via INTRAVENOUS

## 2019-01-22 MED ORDER — FENTANYL CITRATE (PF) 100 MCG/2ML IJ SOLN: 25.0000 ug | INTRAMUSCULAR | Status: DC | PRN

## 2019-01-22 MED ORDER — EPHEDRINE SULFATE 50 MG/ML IJ SOLN
INTRAMUSCULAR | Status: DC | PRN
  Administered 2019-01-22: 10 mg via INTRAVENOUS

## 2019-01-22 MED ORDER — KETOROLAC TROMETHAMINE 30 MG/ML IJ SOLN: 30.0000 mg | Freq: Once | INTRAMUSCULAR | Status: DC | PRN

## 2019-01-22 MED ORDER — DEXAMETHASONE SODIUM PHOSPHATE 4 MG/ML IJ SOLN
INTRAMUSCULAR | Status: DC | PRN
  Administered 2019-01-22: 10 mg via INTRAVENOUS

## 2019-01-22 MED ORDER — FENTANYL CITRATE (PF) 100 MCG/2ML IJ SOLN
50.0000 ug | INTRAMUSCULAR | Status: DC | PRN
  Administered 2019-01-22: 100 ug via INTRAVENOUS

## 2019-01-22 MED ORDER — ONDANSETRON HCL 4 MG/2ML IJ SOLN
INTRAMUSCULAR | Status: AC
  Filled 2019-01-22: qty 2

## 2019-01-22 MED ORDER — BUPIVACAINE HCL (PF) 0.25 % IJ SOLN
INTRAMUSCULAR | Status: DC | PRN
  Administered 2019-01-22: 10 mL

## 2019-01-22 MED ORDER — MIDAZOLAM HCL 2 MG/2ML IJ SOLN
1.0000 mg | INTRAMUSCULAR | Status: DC | PRN
  Administered 2019-01-22: 08:00:00 2 mg via INTRAVENOUS

## 2019-01-22 MED ORDER — BUPIVACAINE HCL (PF) 0.25 % IJ SOLN
INTRAMUSCULAR | Status: AC
  Filled 2019-01-22: qty 30

## 2019-01-22 MED ORDER — PROPOFOL 10 MG/ML IV BOLUS
INTRAVENOUS | Status: DC | PRN
  Administered 2019-01-22: 150 mg via INTRAVENOUS

## 2019-01-22 MED ORDER — LIDOCAINE-EPINEPHRINE 1 %-1:100000 IJ SOLN
INTRAMUSCULAR | Status: AC
  Filled 2019-01-22: qty 1

## 2019-01-22 SURGICAL SUPPLY — 77 items
BAG DECANTER FOR FLEXI CONT (MISCELLANEOUS) ×2 IMPLANT
BINDER ABDOMINAL  9 SM 30-45 (SOFTGOODS)
BINDER ABDOMINAL 10 UNV 27-48 (MISCELLANEOUS) IMPLANT
BINDER ABDOMINAL 12 SM 30-45 (SOFTGOODS) IMPLANT
BINDER ABDOMINAL 9 SM 30-45 (SOFTGOODS) IMPLANT
BINDER BREAST LRG (GAUZE/BANDAGES/DRESSINGS) IMPLANT
BINDER BREAST MEDIUM (GAUZE/BANDAGES/DRESSINGS) IMPLANT
BINDER BREAST XLRG (GAUZE/BANDAGES/DRESSINGS) IMPLANT
BINDER BREAST XXLRG (GAUZE/BANDAGES/DRESSINGS) IMPLANT
BIOPATCH RED 1 DISK 7.0 (GAUZE/BANDAGES/DRESSINGS) IMPLANT
BLADE HEX COATED 2.75 (ELECTRODE) ×2 IMPLANT
BLADE SURG 15 STRL LF DISP TIS (BLADE) ×2 IMPLANT
BLADE SURG 15 STRL SS (BLADE) ×2
BNDG GAUZE ELAST 4 BULKY (GAUZE/BANDAGES/DRESSINGS) ×4 IMPLANT
CANISTER SUCT 1200ML W/VALVE (MISCELLANEOUS) ×2 IMPLANT
CHLORAPREP W/TINT 26 (MISCELLANEOUS) ×2 IMPLANT
CORD BIPOLAR FORCEPS 12FT (ELECTRODE) IMPLANT
COVER BACK TABLE REUSABLE LG (DRAPES) ×2 IMPLANT
COVER MAYO STAND REUSABLE (DRAPES) ×2 IMPLANT
COVER WAND RF STERILE (DRAPES) IMPLANT
DECANTER SPIKE VIAL GLASS SM (MISCELLANEOUS) IMPLANT
DERMABOND ADVANCED (GAUZE/BANDAGES/DRESSINGS)
DERMABOND ADVANCED .7 DNX12 (GAUZE/BANDAGES/DRESSINGS) IMPLANT
DRAIN CHANNEL 19F RND (DRAIN) IMPLANT
DRAPE LAPAROSCOPIC ABDOMINAL (DRAPES) ×2 IMPLANT
DRSG PAD ABDOMINAL 8X10 ST (GAUZE/BANDAGES/DRESSINGS) ×4 IMPLANT
ELECT BLADE 4.0 EZ CLEAN MEGAD (MISCELLANEOUS) ×2
ELECT REM PT RETURN 9FT ADLT (ELECTROSURGICAL) ×2
ELECTRODE BLDE 4.0 EZ CLN MEGD (MISCELLANEOUS) ×1 IMPLANT
ELECTRODE REM PT RTRN 9FT ADLT (ELECTROSURGICAL) ×1 IMPLANT
EVACUATOR SILICONE 100CC (DRAIN) IMPLANT
GAUZE SPONGE 4X4 12PLY STRL LF (GAUZE/BANDAGES/DRESSINGS) IMPLANT
GLOVE BIO SURGEON STRL SZ 6.5 (GLOVE) ×6 IMPLANT
GLOVE BIO SURGEON STRL SZ7 (GLOVE) ×2 IMPLANT
GOWN STRL REUS W/ TWL LRG LVL3 (GOWN DISPOSABLE) ×2 IMPLANT
GOWN STRL REUS W/TWL LRG LVL3 (GOWN DISPOSABLE) ×2
IMPL GEL HP 590CC (Breast) ×2 IMPLANT
IMPLANT GEL HP 590CC (Breast) ×4 IMPLANT
IV NS 1000ML (IV SOLUTION)
IV NS 1000ML BAXH (IV SOLUTION) IMPLANT
IV NS 500ML (IV SOLUTION)
IV NS 500ML BAXH (IV SOLUTION) IMPLANT
KIT FILL SYSTEM UNIVERSAL (SET/KITS/TRAYS/PACK) IMPLANT
LINER CANISTER 1000CC FLEX (MISCELLANEOUS) ×2 IMPLANT
NDL SAFETY ECLIPSE 18X1.5 (NEEDLE) ×1 IMPLANT
NEEDLE HYPO 18GX1.5 SHARP (NEEDLE) ×1
NEEDLE HYPO 25X1 1.5 SAFETY (NEEDLE) ×2 IMPLANT
NS IRRIG 1000ML POUR BTL (IV SOLUTION) IMPLANT
PACK BASIN DAY SURGERY FS (CUSTOM PROCEDURE TRAY) ×2 IMPLANT
PAD ALCOHOL SWAB (MISCELLANEOUS) ×2 IMPLANT
PENCIL SMOKE EVACUATOR (MISCELLANEOUS) ×2 IMPLANT
PIN SAFETY STERILE (MISCELLANEOUS) IMPLANT
SIZER BREAST REUSE 535CC (SIZER) ×1
SIZER BREAST REUSE 590CC (SIZER) ×2
SIZER BRST REUSE 590CC (SIZER) ×1 IMPLANT
SIZER BRST REUSE ULT HI 535CC (SIZER) ×1 IMPLANT
SLEEVE SCD COMPRESS KNEE MED (MISCELLANEOUS) ×2 IMPLANT
SPONGE LAP 18X18 RF (DISPOSABLE) ×4 IMPLANT
STRIP SUTURE WOUND CLOSURE 1/2 (SUTURE) IMPLANT
SUT MNCRL AB 4-0 PS2 18 (SUTURE) ×4 IMPLANT
SUT MON AB 3-0 SH 27 (SUTURE) ×5
SUT MON AB 3-0 SH27 (SUTURE) ×5 IMPLANT
SUT MON AB 5-0 PS2 18 (SUTURE) ×6 IMPLANT
SUT PDS AB 2-0 CT2 27 (SUTURE) IMPLANT
SUT VIC AB 3-0 SH 27 (SUTURE)
SUT VIC AB 3-0 SH 27X BRD (SUTURE) IMPLANT
SUT VICRYL 4-0 PS2 18IN ABS (SUTURE) IMPLANT
SYR 50ML LL SCALE MARK (SYRINGE) ×2 IMPLANT
SYR BULB IRRIGATION 50ML (SYRINGE) ×2 IMPLANT
SYR CONTROL 10ML LL (SYRINGE) ×2 IMPLANT
SYR TB 1ML LL NO SAFETY (SYRINGE) ×2 IMPLANT
TOWEL GREEN STERILE FF (TOWEL DISPOSABLE) ×4 IMPLANT
TUBE CONNECTING 20X1/4 (TUBING) ×2 IMPLANT
TUBING INFILTRATION IT-10001 (TUBING) IMPLANT
TUBING SET GRADUATE ASPIR 12FT (MISCELLANEOUS) ×2 IMPLANT
UNDERPAD 30X36 HEAVY ABSORB (UNDERPADS AND DIAPERS) ×4 IMPLANT
YANKAUER SUCT BULB TIP NO VENT (SUCTIONS) ×4 IMPLANT

## 2019-01-22 NOTE — Transfer of Care (Signed)
Immediate Anesthesia Transfer of Care Note  Patient: Tina Monroe  Procedure(s) Performed: REMOVAL OF BILATERAL TISSUE EXPANDERS WITH PLACEMENT OF BILATERAL BREAST IMPLANTS (Bilateral Breast)  Patient Location: PACU  Anesthesia Type:General  Level of Consciousness: awake, alert  and oriented  Airway & Oxygen Therapy: Patient Spontanous Breathing and Patient connected to face mask oxygen  Post-op Assessment: Report given to RN and Post -op Vital signs reviewed and stable  Post vital signs: Reviewed and stable  Last Vitals:  Vitals Value Taken Time  BP    Temp    Pulse    Resp    SpO2      Last Pain:  Vitals:   01/22/19 0641  TempSrc: [place]  PainSc: 0-No pain         Complications: No apparent anesthesia complications

## 2019-01-22 NOTE — Anesthesia Postprocedure Evaluation (Signed)
Anesthesia Post Note  Patient: Tina Monroe  Procedure(s) Performed: REMOVAL OF BILATERAL TISSUE EXPANDERS WITH PLACEMENT OF BILATERAL BREAST IMPLANTS (Bilateral Breast)     Patient location during evaluation: PACU Anesthesia Type: General Level of consciousness: awake and alert, oriented and patient cooperative Pain management: pain level controlled Vital Signs Assessment: post-procedure vital signs reviewed and stable Respiratory status: spontaneous breathing, nonlabored ventilation and respiratory function stable Cardiovascular status: blood pressure returned to baseline and stable Postop Assessment: no apparent nausea or vomiting Anesthetic complications: no    Last Vitals:  Vitals:   01/22/19 0940 01/22/19 0945  BP:  (!) 124/55  Pulse: 84 72  Resp: (!) 25 16  Temp:    SpO2: 99% 94%    Last Pain:  Vitals:   01/22/19 0945  TempSrc:   PainSc: [place]

## 2019-01-22 NOTE — Op Note (Addendum)
Op report Bilateral Exchange   DATE OF OPERATION: 01/22/2019  LOCATION: [facility]  SURGICAL DIVISION: Plastic Surgery  PREOPERATIVE DIAGNOSES:  1.History of breast cancer.  2. Acquired absence of bilateral breast.   POSTOPERATIVE DIAGNOSES:  1. History of breast cancer.  2. Acquired absence of bilateral breast.   PROCEDURE:  1. Bilateral exchange of tissue expanders for implants.  2. Bilateral capsulotomies for implant respositioning.  SURGEON: Yasir Kitner Sanger Jesiah Grismer, DO  ASSISTANT: Phoebe Sharps, PA  ANESTHESIA:  General.   COMPLICATIONS: None.   IMPLANTS: Left - Mentor Smooth Round Ultra High Profile Gel 590cc. Ref [id number].  Serial Number [id number] - Mentor Smooth Round Ultra High Profile Gel 590cc. Ref [id number].  Serial Number [id number]  INDICATIONS FOR PROCEDURE:  The patient, Tina Monroe, is a 48 y.o. female born on 1970-10-02, is here for treatment after bilateral mastectomies.  She had tissue expanders placed at the time of mastectomies. She now presents for exchange of her expanders for implants.  She requires capsulotomies to better position the implants. MRN: [id number]  CONSENT:  Informed consent was obtained directly from the patient. Risks, benefits and alternatives were fully discussed. Specific risks including but not limited to bleeding, infection, hematoma, seroma, scarring, pain, implant infection, implant extrusion, capsular contracture, asymmetry, wound healing problems, and need for further surgery were all discussed. The patient did have an ample opportunity to have her questions answered to her satisfaction.   DESCRIPTION OF PROCEDURE:  The patient was taken to the operating room. SCDs were placed and IV antibiotics were given. The patient's chest was prepped and draped in a sterile fashion. A time out was performed and the implants to be used were identified.    On the right breast: One percent Lidocaine with  epinephrine was used to infiltrate at the incision site. The old mastectomy scar was excised.  The mastectomy flaps from the superior and inferior flaps were raised over the pectoralis major muscle for several centimeters to minimize tension for the closure. The pectoralis was split inferior to the skin incision to expose and remove the tissue expander.  Inspection of the pocket showed a normal healthy capsule and good integration of the biologic matrix.  The pocket was irrigated with antibiotic solution.  Circumferential capsulotomies were performed to allow for breast pocket expansion.  Measurements were made and a sizer used to confirm adequate pocket size for the implant dimensions.  Hemostasis was ensured with electrocautery. New gloves were placed. The implant was soaked in antibiotic solution and then placed in the pocket and oriented appropriately. The pectoralis major muscle and capsule on the anterior surface were re-closed with a 3-0 Monocryl suture. The remaining skin was closed with 4-0 Monocryl deep dermal and 5-0 Monocryl subcuticular stitches.   On the left breast: The old mastectomy scar was excised.  The mastectomy flaps from the superior and inferior flaps were raised over the pectoralis major muscle for several centimeters to minimize tension for the closure. The pectoralis was split inferior to the skin incision to expose and remove the tissue expander.  Inspection of the pocket showed a normal healthy capsule and good integration of the biologic matrix.   Circumferential capsulotomies were performed to allow for breast pocket expansion.  Measurements were made and a sizer utilized to confirm adequate pocket size for the implant dimensions.  Hemostasis was ensured with the electrocautery.  New gloves were applied. The implant was soaked in antibiotic solution and placed in the pocket and oriented appropriately.  The pectoralis major muscle and capsule on the anterior surface were re-closed  with a 3-0 Monocryl suture. The remaining skin was closed with 4-0 Monocryl deep dermal and 5-0 Monocryl subcuticular stitches.  Dermabond was applied to the incision site. A breast binder and ABDs were placed.  The patient was allowed to wake from anesthesia and taken to the recovery room in satisfactory condition.   The advanced practice practitioner (APP) assisted throughout the case.  The APP was essential in retraction and counter traction when needed to make the case progress smoothly.  This retraction and assistance made it possible to see the tissue plans for the procedure.  The assistance was needed for blood control, tissue re-approximation and assisted with closure of the incision site.

## 2019-01-22 NOTE — Anesthesia Preprocedure Evaluation (Addendum)
Anesthesia Evaluation  Patient identified by MRN, date of birth, ID band Patient awake    Reviewed: Allergy & Precautions, NPO status , Patient's Chart, lab work & pertinent test results  Airway Mallampati: II  TM Distance: >3 FB Neck ROM: Full    Dental no notable dental hx.    Pulmonary neg pulmonary ROS,    Pulmonary exam normal breath sounds clear to auscultation       Cardiovascular negative cardio ROS Normal cardiovascular exam Rhythm:Regular Rate:Normal     Neuro/Psych negative neurological ROS  negative psych ROS   GI/Hepatic negative GI ROS, Neg liver ROS,   Endo/Other  negative endocrine ROS  Renal/GU negative Renal ROS  negative genitourinary   Musculoskeletal negative musculoskeletal ROS (+)   Abdominal Normal abdominal exam  (+)   Peds negative pediatric ROS (+)  Hematology negative hematology ROS (+)   Anesthesia Other Findings Hx breast ca s/p B/L mastectomy 02/2018, tissue expanders placed 10/2018  Reproductive/Obstetrics negative OB ROS                             Anesthesia Physical  Anesthesia Plan  ASA: I  Anesthesia Plan: General   Post-op Pain Management:    Induction: Intravenous  PONV Risk Score and Plan: 3 and Ondansetron, Dexamethasone, Midazolam and Treatment may vary due to age or medical condition  Airway Management Planned: Oral ETT  Additional Equipment: None  Intra-op Plan:   Post-operative Plan: Extubation in OR  Informed Consent: I have reviewed the patients History and Physical, chart, labs and discussed the procedure including the risks, benefits and alternatives for the proposed anesthesia with the patient or authorized representative who has indicated his/her understanding and acceptance.     Dental advisory given  Plan Discussed with: CRNA  Anesthesia Plan Comments: (Severe itching with dilaudid, does well with fentanyl)       Anesthesia Quick Evaluation

## 2019-01-22 NOTE — Interval H&P Note (Signed)
History and Physical Interval Note:  01/22/2019 7:04 AM  [name]  has presented today for surgery, with the diagnosis of History Of Bilateral Mastectomy.  The various methods of treatment have been discussed with the patient and family. After consideration of risks, benefits and other options for treatment, the patient has consented to  Procedure(s): REMOVAL OF BILATERAL TISSUE EXPANDERS WITH PLACEMENT OF BILATERAL BREAST IMPLANTS (Bilateral) LIPOSUCTION (Bilateral) as a surgical intervention.  The patient's history has been reviewed, patient examined, no change in status, stable for surgery.  I have reviewed the patient's chart and labs.  Questions were answered to the patient's satisfaction.     Tina Monroe

## 2019-01-22 NOTE — Discharge Instructions (Signed)
Post Anesthesia Home Care Instructions  Activity: Get plenty of rest for the remainder of the day. A responsible individual must stay with you for 24 hours following the procedure.  For the next 24 hours, DO NOT: -Drive a car -Paediatric nurse -Drink alcoholic beverages -Take any medication unless instructed by your physician -Make any legal decisions or sign important papers.  Meals: Start with liquid foods such as gelatin or soup. Progress to regular foods as tolerated. Avoid greasy, spicy, heavy foods. If nausea and/or vomiting occur, drink only clear liquids until the nausea and/or vomiting subsides. Call your physician if vomiting continues.  Special Instructions/Symptoms: Your throat may feel dry or sore from the anesthesia or the breathing tube placed in your throat during surgery. If this causes discomfort, gargle with warm salt water. The discomfort should disappear within 24 hours.  If you had a scopolamine patch placed behind your ear for the management of post- operative nausea and/or vomiting:  1. The medication in the patch is effective for 72 hours, after which it should be removed.  Wrap patch in a tissue and discard in the trash. Wash hands thoroughly with soap and water. 2. You may remove the patch earlier than 72 hours if you experience unpleasant side effects which may include dry mouth, dizziness or visual disturbances. 3. Avoid touching the patch. Wash your hands with soap and water after contact with the patch.    INSTRUCTIONS FOR AFTER BREAST SURGERY   You are getting ready to undergo breast surgery.  You will likely have some questions about what to expect following your operation.  The following information will help you and your family understand what to expect when you are discharged from the hospital.  Following these guidelines will help ensure a smooth recovery and reduce risks of complications.   Postoperative instructions include information on: diet,  wound care, medications and physical activity.  AFTER SURGERY Expect to go home after the procedure.  In some cases, you may need to spend one night in the hospital for observation.  DIET Breast surgery does not require a specific diet.  However, the healthier you eat the better your body can start healing. It is important to increasing your protein intake.  This means limiting the foods with sugar and carbohydrates.  Focus on vegetables and some meat.  If you have any liposuction during your procedure be sure to drink water.  If your urine is bright yellow, then it is concentrated, and you need to drink more water.  As a general rule after surgery, you should have 8 ounces of water every hour while awake.  If you find you are persistently nauseated or unable to take in liquids let us know.  NO TOBACCO USE or EXPOSURE.  This will slow your healing process and increase the risk of a wound.  WOUND CARE If you don't have a drain:  You can shower the day after surgery. Use fragrance free soap.  Dial, [place] and [place] are usually mild on the skin. If you have steri-strips / tape directly attached to your skin leave them in place. It is OK to get these wet.  No baths, pools or hot tubs for two weeks. We close your incision to leave the smallest and best-looking scar. No ointment or creams on your incisions until given the go ahead.  Especially not Neosporin (Too many skin reactions with this one).  A few weeks after surgery you can use Mederma and start massaging the scar. We  ask you to wear your binder or sports bra for the first 6 weeks around the clock, including while sleeping. This provides added comfort and helps reduce the fluid accumulation at the surgery site.  ACTIVITY No heavy lifting until cleared by the doctor.  This usually means no more than a half-gallon of milk.  It is OK to walk and climb stairs. In fact, moving your legs is very important to decrease your risk of a blood clot.  It will also  help keep you from getting deconditioned.  Every 1 to 2 hours get up and walk for 5 minutes. This will help with a quicker recovery back to normal.  Let pain be your guide so you don't do too much.  This is not the time for spring cleaning and don't plan on taking care of anyone else.  This time is for you to recover,  You will be more comfortable if you sleep and rest with your head elevated either with a few pillows under you or in a recliner.  No stomach sleeping for a three months.  WORK Everyone returns to work at different times. As a rough guide, most people take at least 1 - 2 weeks off prior to returning to work. If you need documentation for your job, bring the forms to your postoperative follow up visit.  DRIVING Arrange for someone to bring you home from the hospital.  You may be able to drive a few days after surgery but not while taking any narcotics or valium.  BOWEL MOVEMENTS Constipation can occur after anesthesia and while taking pain medication.  It is important to stay ahead for your comfort.  We recommend taking Milk of Magnesia (2 tablespoons; twice a day) while taking the pain pills.  SEROMA This is fluid your body tried to put in the surgical site.  This is normal but if it creates tight skinny skin let us know.  It usually decreases in a few weeks.  MEDICATIONS and PAIN CONTROL At your preoperative visit for you history and physical you were given the following medications: 1. An antibiotic: Start this medication when you get home and take according to the instructions on the bottle. 2. Zofran 4 mg:  This is to treat nausea and vomiting.  You can take this every 6 hours as needed and only if needed. 3. Valium 2 mg: This is for muscle tightness if you have an implant or expander. This will help relax your muscle which also helps with pain control.  This can be taken every 12 hours as needed.  Don't drive after taking this medication. 4. Norco (hydrocodone/acetaminophen)  5/325 mg:  This is only to be used after you have taken the motrin or the tylenol. Every 8 hours as needed. Over the counter Medication to take: 5. Ibuprofen (Motrin) 600 mg:  Take this every 6 hours.  If you have additional pain then take 500 mg of the tylenol.  Only take the Norco after you have tried these two. 6. Miralax or stool softener of choice: Take this according to the bottle if you take the [place] Call your surgeon's office if any of the following occur:  Fever 101 degrees F or greater  Excessive bleeding or fluid from the incision site.  Pain that increases over time without aid from the medications  Redness, warmth, or pus draining from incision sites  Persistent nausea or inability to take in liquids  Severe misshapen area that underwent the  operation.

## 2019-01-23 ENCOUNTER — Encounter (HOSPITAL_BASED_OUTPATIENT_CLINIC_OR_DEPARTMENT_OTHER): Payer: Self-pay | Admitting: Plastic Surgery

## 2019-01-28 ENCOUNTER — Telehealth: Payer: Self-pay | Admitting: Plastic Surgery

## 2019-01-28 NOTE — Telephone Encounter (Signed)

## 2019-01-29 ENCOUNTER — Encounter: Payer: Self-pay | Admitting: Surgical

## 2019-01-29 ENCOUNTER — Other Ambulatory Visit: Payer: Self-pay

## 2019-01-29 ENCOUNTER — Ambulatory Visit (INDEPENDENT_AMBULATORY_CARE_PROVIDER_SITE_OTHER): Payer: BC Managed Care – PPO | Admitting: Surgical

## 2019-01-29 VITALS — BP 127/85 | HR 64 | Temp 97.5°F | Ht 63.0 in | Wt 175.6 lb

## 2019-01-29 DIAGNOSIS — Z9889 Other specified postprocedural states: Secondary | ICD-10-CM

## 2019-01-29 DIAGNOSIS — Z9013 Acquired absence of bilateral breasts and nipples: Secondary | ICD-10-CM

## 2019-01-29 DIAGNOSIS — C50511 Malignant neoplasm of lower-outer quadrant of right female breast: Secondary | ICD-10-CM

## 2019-01-29 DIAGNOSIS — Z170 Estrogen receptor positive status [ER+]: Secondary | ICD-10-CM

## 2019-01-29 NOTE — Progress Notes (Signed)
   Subjective:     Patient ID: Tina Monroe, female    DOB: [birth year], 48 y.o.   MRN: [id number]  Chief Complaint  Patient presents with  . Post-op Follow-up    for removal of (B) breast expander and placement of implants w/liposuction   HPI: The patient is a 48 y.o. female here for follow-up after bilateral exchange of tissue expanders for implants and bilateral capsulotomies for implant repositioning on 01/22/19. She is here for 1 week post-op visit.  She has 590 cc mentor smooth round high profile gel 590 cc implants placed. She is doing really well.  She is very happy with her outcome.  Her incisions are healing well.  Dermabond in place.  There is no surrounding erythema, no sign of infection, no sign of hematoma/seroma.  Her pain is adequately controlled.  Review of Systems  Constitutional: Positive for activity change. Negative for appetite change, chills, diaphoresis, fatigue and fever.  Cardiovascular: Negative for chest pain.  Gastrointestinal: Negative for diarrhea, nausea and vomiting.  Musculoskeletal: Negative for myalgias.  Skin: Negative for color change, pallor, rash and wound.  Neurological: Negative for dizziness and weakness.    Objective:   Vital Signs BP 127/85 (BP Location: Left Arm, Patient Position: Sitting, Cuff Size: Normal)   Pulse 64   Temp (!) 97.5 F (36.4 C) (Temporal)   Ht 5\' 3"  (1.6 m)   Wt 175 lb 9.6 oz (79.7 kg)   SpO2 100%   BMI 31.11 kg/m  Vital Signs and Nursing Note Reviewed Chaperone present Physical Exam  Constitutional: She is oriented to person, place, and time and well-developed, well-nourished, and in no distress.  HENT:  Head: Normocephalic and atraumatic.  Cardiovascular: Normal rate.  Pulmonary/Chest: Effort normal.    Bilateral breasts implants are slightly high riding.  Musculoskeletal:        General: Normal range of motion.  Neurological: She is alert and oriented to person, place, and time. Gait normal.   Skin: Skin is warm and dry. No rash noted. She is not diaphoretic. No erythema. No pallor.  Psychiatric: Mood and affect normal.      Assessment/Plan:     ICD-10-CM   1. S/P breast reconstruction  Z98.890   2. Malignant neoplasm of lower-outer quadrant of right breast of female, estrogen receptor positive ([place])  C50.511    Z17.0   3. History of bilateral mastectomy  Z90.13    [name] is doing really well.  She is very happy with her progress.  She is healing really well.  There is no sign of infection, seroma, hematoma. Continue wearing breast binder or sports bra 24/7 for 6 weeks postop.  She can then transition into wearing sports bra during the day only.  After about 4 to 6 months she can begin wearing regular bra without underwire.  Continue to eat a healthy diet, multivitamin, vitamin C.  Plan for follow-up in 3 to 4 weeks for reevaluation.  If she is doing well prior to that visit she can reschedule for February.  She is interested in NAC tattooing.  Will allow incisions to heal for approximately 4 to 6 months and then we can schedule tattooing session.  Call with any questions or concerns prior to next visit   Charlies Constable, PA-C 01/29/2019, 2:29 PM

## 2019-02-20 HISTORY — PX: HYSTERECTOMY ABDOMINAL WITH SALPINGECTOMY: SHX6725

## 2019-02-24 ENCOUNTER — Telehealth: Payer: Self-pay | Admitting: Plastic Surgery

## 2019-02-24 NOTE — Telephone Encounter (Signed)

## 2019-02-25 ENCOUNTER — Ambulatory Visit: Payer: BC Managed Care – PPO | Admitting: Surgical

## 2019-02-25 ENCOUNTER — Other Ambulatory Visit: Payer: Self-pay

## 2019-02-25 ENCOUNTER — Encounter: Payer: Self-pay | Admitting: Surgical

## 2019-02-25 VITALS — BP 122/82 | HR 61 | Temp 97.3°F | Ht 63.0 in | Wt 181.0 lb

## 2019-02-25 DIAGNOSIS — Z9889 Other specified postprocedural states: Secondary | ICD-10-CM

## 2019-02-25 DIAGNOSIS — Z170 Estrogen receptor positive status [ER+]: Secondary | ICD-10-CM

## 2019-02-25 DIAGNOSIS — C50511 Malignant neoplasm of lower-outer quadrant of right female breast: Secondary | ICD-10-CM

## 2019-02-25 DIAGNOSIS — Z9013 Acquired absence of bilateral breasts and nipples: Secondary | ICD-10-CM

## 2019-02-25 NOTE — Progress Notes (Signed)
Mrs. Saker is a 48 year old female here for follow-up after breast reconstruction.  She reports overall she has been feeling well.  She has not had any fevers, chills, nausea, vomiting.  Her incisions are healing well.  She does report that she has had a lot of weight gain over the past few months - Approximately 25 pounds of weight gain since starting to take tamoxifen.  She also has some concerns about her breast not being as symmetric as she had hoped.  The right breast implant is still slightly high riding and has not dropped into place, the left has slightly dropped.  She also has a small area at the right breast medial incision that is slightly protruding further out than the rest of the tissue. She had 2 areas of concern that had Monocryl sutures ends slightly protruding through the skin, these were removed.   Overall Mrs. Hewgley is healing really well.  In regards to the asymmetry, recommend waiting for implants to drop into place, I do notice the right breast implant is riding higher.  Would recommend at least 6 months postop to allow settling.   She can begin using Mederma cream on the incisions.  She is going to work with her oncology team to figure out weight gain with tamoxifen. We will also plan to do NAC tattoos approximately 6 months postop, will place patient info with tattoo team to place on list.  Please call with any questions or concerns.  Follow up in 3 months.

## 2019-03-09 DIAGNOSIS — C50511 Malignant neoplasm of lower-outer quadrant of right female breast: Secondary | ICD-10-CM

## 2019-05-19 ENCOUNTER — Ambulatory Visit: Payer: BC Managed Care – PPO | Admitting: Plastic Surgery

## 2019-06-09 ENCOUNTER — Other Ambulatory Visit: Payer: Self-pay

## 2019-06-09 ENCOUNTER — Encounter: Payer: Self-pay | Admitting: Plastic Surgery

## 2019-06-09 ENCOUNTER — Ambulatory Visit (INDEPENDENT_AMBULATORY_CARE_PROVIDER_SITE_OTHER): Payer: BC Managed Care – PPO | Admitting: Plastic Surgery

## 2019-06-09 VITALS — BP 140/90 | HR 78 | Temp 97.3°F | Ht 63.0 in | Wt 183.4 lb

## 2019-06-09 DIAGNOSIS — Z9013 Acquired absence of bilateral breasts and nipples: Secondary | ICD-10-CM

## 2019-06-09 DIAGNOSIS — Z9889 Other specified postprocedural states: Secondary | ICD-10-CM

## 2019-06-10 ENCOUNTER — Encounter: Payer: Self-pay | Admitting: Plastic Surgery

## 2019-06-10 DIAGNOSIS — Z9889 Other specified postprocedural states: Secondary | ICD-10-CM | POA: Insufficient documentation

## 2019-06-10 NOTE — Progress Notes (Signed)
   Subjective:    Patient ID: Tina Monroe, female    DOB: 1970-03-25, 49 y.o.   MRN: [id number]  The patient is a 49 year old female here for follow up on her bilateral breast reconstruction.  Overall she is doing well.  All incisions have healed nicely.  There is no sign of seroma or hematoma in the breast pocket.  The patient has gained about 20 pounds.  This may be affecting the overall appearance of her reconstruction.  She does not like the way they move laterally when she lays flat.  She would like more fullness medial aspect.  The left side seems to have a little more movement than the right.  On exam she has nice symmetry.  There is slight differences as we would expect with normal anatomy.  The right implant is slightly higher than the left.  There does not appear to be any capsular contracture.     Review of Systems  Constitutional: Negative.   HENT: Negative.   Eyes: Negative.   Respiratory: Negative.   Cardiovascular: Negative.   Gastrointestinal: Negative.   Endocrine: Negative.   Genitourinary: Negative.   Musculoskeletal: Negative.        Objective:   Physical Exam Vitals and nursing note reviewed.  Constitutional:      Appearance: Normal appearance.  HENT:     Head: Normocephalic and atraumatic.  Cardiovascular:     Rate and Rhythm: Normal rate.     Pulses: Normal pulses.  Pulmonary:     Effort: Pulmonary effort is normal.  Abdominal:     General: Abdomen is flat. There is no distension.  Neurological:     Mental Status: She is alert.  Psychiatric:        Mood and Affect: Mood normal.        Assessment & Plan:     ICD-10-CM   1. History of bilateral mastectomy  Z90.13   2. S/P breast reconstruction, bilateral  Z98.890     We discussed options for revision.  These include fat filling of the breast on the superior medial aspect.  We could also narrow the pocket with plication laterally.  This could be limited by the size of the implant.   We could raise the left side or lower the right side depending on the patient's preference.  Overall I recommend returning to her preoperative weight prior to surgery.  I am concerned that revising the breast reconstruction and then weight loss would affect the results. The patient is in agreement and we have made a referral to the [facility].  I would like to see her back in 6 months.  Pictures were obtained of the patient and placed in the chart with the patient's or guardian's permission.

## 2019-09-08 ENCOUNTER — Encounter (INDEPENDENT_AMBULATORY_CARE_PROVIDER_SITE_OTHER): Payer: Self-pay | Admitting: Family Medicine

## 2019-09-08 ENCOUNTER — Other Ambulatory Visit: Payer: Self-pay

## 2019-09-08 ENCOUNTER — Ambulatory Visit (INDEPENDENT_AMBULATORY_CARE_PROVIDER_SITE_OTHER): Payer: BC Managed Care – PPO | Admitting: Family Medicine

## 2019-09-08 VITALS — BP 132/80 | HR 55 | Temp 98.0°F | Ht 63.0 in | Wt 186.0 lb

## 2019-09-08 DIAGNOSIS — Z1331 Encounter for screening for depression: Secondary | ICD-10-CM

## 2019-09-08 DIAGNOSIS — G4709 Other insomnia: Secondary | ICD-10-CM

## 2019-09-08 DIAGNOSIS — R0602 Shortness of breath: Secondary | ICD-10-CM

## 2019-09-08 DIAGNOSIS — R5383 Other fatigue: Secondary | ICD-10-CM | POA: Diagnosis not present

## 2019-09-08 DIAGNOSIS — F419 Anxiety disorder, unspecified: Secondary | ICD-10-CM

## 2019-09-08 DIAGNOSIS — Z0289 Encounter for other administrative examinations: Secondary | ICD-10-CM

## 2019-09-08 DIAGNOSIS — Z9189 Other specified personal risk factors, not elsewhere classified: Secondary | ICD-10-CM

## 2019-09-08 DIAGNOSIS — E669 Obesity, unspecified: Secondary | ICD-10-CM

## 2019-09-08 DIAGNOSIS — C50919 Malignant neoplasm of unspecified site of unspecified female breast: Secondary | ICD-10-CM | POA: Diagnosis not present

## 2019-09-08 DIAGNOSIS — Z8719 Personal history of other diseases of the digestive system: Secondary | ICD-10-CM | POA: Diagnosis not present

## 2019-09-08 DIAGNOSIS — Z6832 Body mass index (BMI) 32.0-32.9, adult: Secondary | ICD-10-CM

## 2019-09-08 NOTE — Progress Notes (Signed)
Dear Dr. Marla Roe,   Thank you for referring Tina Monroe to our clinic. The following note includes my evaluation and treatment recommendations.  Chief Complaint:   OBESITY Tina Monroe (MR# [id number]) is a 49 y.o. female who presents for evaluation and treatment of obesity and related comorbidities. Current BMI is Body mass index is 32.95 kg/m. Tina Monroe has been struggling with her weight for many years and has been unsuccessful in either losing weight, maintaining weight loss, or reaching her healthy weight goal.  Tina Monroe is currently in the action stage of change and ready to dedicate time achieving and maintaining a healthier weight. Tina Monroe is interested in becoming our patient and working on intensive lifestyle modifications including (but not limited to) diet and exercise for weight loss.  Tina Monroe lives with Tina Monroe, her husband.  She was referred by Dr. Marla Roe.  She was diagnosed with breast cancer in 01/2018.  She had a double mastectomy in 02/2018 with double reconstruction in 10/2018.  She needs to get the weight off to undergo further surgery.  With tamoxifen, she gained 40 pounds and increased abdominal fat.  She skips lunch a lot.  She is an Environmental consultant principal.  She endorses increased cravings in the afternoon (carbs).  She eats out 5 days per week on average.  Her oncologist is Dr. Hinton Rao at [facility].  Lynde's habits were reviewed today and are as follows: Her family eats meals together, she thinks her family will eat healthier with her, her desired weight loss is 53 pounds, her heaviest weight ever was her current weight, she craves sugary foods, she skips lunch frequently, she is frequently drinking liquids with calories, she frequently makes poor food choices, she frequently eats larger portions than normal and she struggles with emotional eating.  Depression Screen Pasqualina's Food and Mood (modified PHQ-9) score was  11.  Depression screen PHQ 2/9 09/08/2019  Decreased Interest 2  Down, Depressed, Hopeless 1  PHQ - 2 Score 3  Altered sleeping 1  Tired, decreased energy 2  Change in appetite 2  Feeling bad or failure about yourself  1  Trouble concentrating 1  Moving slowly or fidgety/restless 1  Suicidal thoughts 0  PHQ-9 Score 11  Difficult doing work/chores Somewhat difficult   Subjective:   1. Fatigue, unspecified type Tina Monroe admits to daytime somnolence and reports waking up still tired. Patent has a history of symptoms of daytime fatigue, morning fatigue and snoring. Tina Monroe generally gets 7 or 8 hours of sleep per night, and states that she has poor quality sleep. Snoring is present. Apneic episodes are not present. Epworth Sleepiness Score is 5.  2. Shortness of breath on exertion Tina Monroe notes increasing shortness of breath with exercising and seems to be worsening over time with weight gain. She notes getting out of breath sooner with activity than she used to. This has gotten worse recently. Tina Monroe denies shortness of breath at rest or orthopnea.  3. History of chronic constipation Tina Monroe notes constipation. She says her water intake is "not even a bottle" per day. Constipation is not currently an issue for her.   4. Malignant neoplasm of female breast, unspecified estrogen receptor status, unspecified laterality, unspecified site of breast [facility]) She has history of double mastectomy and complete hysterectomy.  5. Other insomnia Tina Monroe awakes nightly since increasing tamoxifen, but has always had difficulty.  She has never sought medical help for it because it is not bothersome enough.  6. Anxiety, with emotional  eating Tina Monroe is struggling with emotional eating and using food for comfort to the extent that it is negatively impacting her health. She has been working on behavior modification techniques to help reduce her emotional eating and has been unsuccessful. She shows no sign  of suicidal or homicidal ideations.  7. Depression screening Tina Monroe was screened for depression as part of her new patient workup.  PHQ-9 is 11.  8. At risk for constipation Tina Monroe is at increased risk for constipation due to inadequate water intake, changes in diet, and/or use of medications such as GLP1 agonists. Tina Monroe denies hard, infrequent stools currently.   Assessment/Plan:   1. Fatigue, unspecified type Tina Monroe does feel that her weight is causing her energy to be lower than it should be. Fatigue may be related to obesity, depression or many other causes. Labs will be ordered, and in the meanwhile, Tina Monroe will focus on self care including making healthy food choices, increasing physical activity and focusing on stress reduction. - EKG 12-Lead - Comprehensive metabolic panel - CBC with Differential/Platelet - Hemoglobin A1c - Insulin, random - Lipid panel - VITAMIN D 25 Hydroxy (Vit-D Deficiency, Fractures) - Vitamin B12 - Folate - TSH - T4, free - T3, free  2. Shortness of breath on exertion Tina Monroe does feel that she gets out of breath more easily that she used to when she exercises. Tina Monroe's shortness of breath appears to be obesity related and exercise induced. She has agreed to work on weight loss and gradually increase exercise to treat her exercise induced shortness of breath. Will continue to monitor closely.  3. History of chronic constipation Long discussion with Tina Monroe that prudent nutritional plan may increase liklihood of constipation.  Recommend 1/2 her weight in water in ounces per day to drink.  Options were discussed.  Tina Monroe was informed that a decrease in bowel movement frequency is normal while losing weight, but stools should not be hard or painful. Orders and follow up as documented in patient record.   Counseling Getting to Good Bowel Health: Your goal is to have one soft bowel movement each day. Drink at least 8 glasses of water each day. Eat  plenty of fiber (goal is over 25 grams each day). It is best to get most of your fiber from dietary sources which includes leafy green vegetables, fresh fruit, and whole grains. You may need to add fiber with the help of OTC fiber supplements. These include Metamucil, Citrucel, and Flaxseed. If you are still having trouble, try adding Miralax or Magnesium Citrate. If all of these changes do not work, Cabin crew.  4. Malignant neoplasm of female breast, unspecified estrogen receptor status, unspecified laterality, unspecified site of breast [facility]) Follow-up with specialists as planned.  5. Other insomnia Continue prudent nutritional plan and eventually increase exercise, which will help with sleep.  The problem of recurrent insomnia was discussed. Orders and follow up as documented in patient record. Counseling: Intensive lifestyle modifications are the first line treatment for this issue. We discussed several lifestyle modifications today and she will continue to work on diet, exercise and weight loss efforts.   Counseling  Limit or avoid alcohol, caffeinated beverages, and cigarettes, especially close to bedtime.   Do not eat a large meal or eat spicy foods right before bedtime. This can lead to digestive discomfort that can make it hard for you to sleep.  Keep a sleep diary to help you and your health care provider figure out what could be causing your insomnia.  Marland Kitchen  Make your bedroom a dark, comfortable place where it is easy to fall asleep. ? Put up shades or blackout curtains to block light from outside. ? Use a white noise machine to block noise. ? Keep the temperature cool. . Limit screen use before bedtime. This includes: ? Watching TV. ? Using your smartphone, tablet, or computer. . Stick to a routine that includes going to bed and waking up at the same times every day and night. This can help you fall asleep faster. Consider making a quiet activity, such as reading, part of  your nighttime routine. . Try to avoid taking naps during the day so that you sleep better at night. . Get out of bed if you are still awake after 15 minutes of trying to sleep. Keep the lights down, but try reading or doing a quiet activity. When you feel sleepy, go back to bed.  6. Anxiety, with emotional eating Declines referral to Dr. Mallie Mussel.  She will let me know if she would like to go in the future.  Continue prudent nutritional plan, weight loss.  7. Depression screening Korinna had a positive depression screening. Depression is commonly associated with obesity and often results in emotional eating behaviors. We will monitor this closely and work on CBT to help improve the non-hunger eating patterns. Referral to Psychology may be required if no improvement is seen as she continues in our clinic.  8. At risk for constipation Berenize was given approximately 15 minutes of counseling today regarding prevention of constipation. She was encouraged to increase water and fiber intake.   9. Class 1 obesity with serious comorbidity and body mass index (BMI) of 32.0 to 32.9 in adult, unspecified obesity type Magdalyn is currently in the action stage of change and her goal is to continue with weight loss efforts. I recommend Leonore begin the structured treatment plan as follows:  She has agreed to the Category 1 Plan.  Exercise goals: As is.   Behavioral modification strategies: increasing lean protein intake, increasing water intake, decreasing liquid calories and planning for success.  She was informed of the importance of frequent follow-up visits to maximize her success with intensive lifestyle modifications for her multiple health conditions. She was informed we would discuss her lab results at her next visit unless there is a critical issue that needs to be addressed sooner. Elcie agreed to keep her next visit at the agreed upon time to discuss these results.  Objective:   Blood  pressure 132/80, pulse (!) 55, temperature 98 F (36.7 C), temperature source Oral, height 5\' 3"  (1.6 m), weight 186 lb (84.4 kg), SpO2 100 %. Body mass index is 32.95 kg/m.  EKG: Normal sinus rhythm, rate 57 bpm.  Indirect Calorimeter completed today shows a VO2 of 156 and a REE of 1085.  Her calculated basal metabolic rate is 2671 thus her basal metabolic rate is worse than expected.  General: Cooperative, alert, well developed, in no acute distress. HEENT: Conjunctivae and lids unremarkable. Cardiovascular: Regular rhythm.  Lungs: Normal work of breathing. Neurologic: No focal deficits.   Attestation Statements:   Reviewed by clinician on day of visit: allergies, medications, problem list, medical history, surgical history, family history, social history, and previous encounter notes.  I, Water quality scientist, CMA, am acting as Location manager for [employer], DO.  I have reviewed the above documentation for accuracy and completeness, and I agree with the above. Mellody Dance, DO

## 2019-09-09 DIAGNOSIS — C50511 Malignant neoplasm of lower-outer quadrant of right female breast: Secondary | ICD-10-CM

## 2019-09-09 LAB — VITAMIN D 25 HYDROXY (VIT D DEFICIENCY, FRACTURES): Vit D, 25-Hydroxy: 30.4 ng/mL (ref 30.0–100.0)

## 2019-09-09 LAB — CBC WITH DIFFERENTIAL/PLATELET
Basophils Absolute: 0.1 10*3/uL (ref 0.0–0.2)
Basos: 1 %
EOS (ABSOLUTE): 0.1 10*3/uL (ref 0.0–0.4)
Eos: 3 %
Hematocrit: 41.8 % (ref 34.0–46.6)
Hemoglobin: 13.5 g/dL (ref 11.1–15.9)
Immature Grans (Abs): 0 10*3/uL (ref 0.0–0.1)
Immature Granulocytes: 1 %
Lymphocytes Absolute: 1.4 10*3/uL (ref 0.7–3.1)
Lymphs: 27 %
MCH: 29.5 pg (ref 26.6–33.0)
MCHC: 32.3 g/dL (ref 31.5–35.7)
MCV: 91 fL (ref 79–97)
Monocytes Absolute: 0.4 10*3/uL (ref 0.1–0.9)
Monocytes: 8 %
Neutrophils Absolute: 3.1 10*3/uL (ref 1.4–7.0)
Neutrophils: 60 %
Platelets: 338 10*3/uL (ref 150–450)
RBC: 4.58 x10E6/uL (ref 3.77–5.28)
RDW: 12.7 % (ref 11.7–15.4)
WBC: 5.1 10*3/uL (ref 3.4–10.8)

## 2019-09-09 LAB — HEMOGLOBIN A1C
Est. average glucose Bld gHb Est-mCnc: 105 mg/dL
Hgb A1c MFr Bld: 5.3 % (ref 4.8–5.6)

## 2019-09-09 LAB — COMPREHENSIVE METABOLIC PANEL
ALT: 17 IU/L (ref 0–32)
AST: 18 IU/L (ref 0–40)
Albumin/Globulin Ratio: 1.8 (ref 1.2–2.2)
Albumin: 4.2 g/dL (ref 3.8–4.9)
Alkaline Phosphatase: 102 IU/L (ref 48–121)
BUN/Creatinine Ratio: 13 (ref 9–23)
BUN: 9 mg/dL (ref 6–24)
Bilirubin Total: 0.5 mg/dL (ref 0.0–1.2)
CO2: 23 mmol/L (ref 20–29)
Calcium: 9 mg/dL (ref 8.7–10.2)
Chloride: 105 mmol/L (ref 96–106)
Creatinine, Ser: 0.72 mg/dL (ref 0.57–1.00)
GFR calc Af Amer: 112 mL/min/{1.73_m2} (ref >59)
GFR calc non Af Amer: 97 mL/min/{1.73_m2} (ref >59)
Globulin, Total: 2.3 g/dL (ref 1.5–4.5)
Glucose: 89 mg/dL (ref 65–99)
Potassium: 4 mmol/L (ref 3.5–5.2)
Sodium: 141 mmol/L (ref 134–144)
Total Protein: 6.5 g/dL (ref 6.0–8.5)

## 2019-09-09 LAB — LIPID PANEL
Chol/HDL Ratio: 3.2 ratio (ref 0.0–4.4)
Cholesterol, Total: 246 mg/dL — ABNORMAL HIGH (ref 100–199)
HDL: 77 mg/dL (ref >39)
LDL Chol Calc (NIH): 157 mg/dL — ABNORMAL HIGH (ref 0–99)
Triglycerides: 71 mg/dL (ref 0–149)
VLDL Cholesterol Cal: 12 mg/dL (ref 5–40)

## 2019-09-09 LAB — T4, FREE: Free T4: 1.27 ng/dL (ref 0.82–1.77)

## 2019-09-09 LAB — INSULIN, RANDOM: INSULIN: 10.3 u[IU]/mL (ref 2.6–24.9)

## 2019-09-09 LAB — TSH: TSH: 2.64 u[IU]/mL (ref 0.450–4.500)

## 2019-09-09 LAB — T3, FREE: T3, Free: 3.5 pg/mL (ref 2.0–4.4)

## 2019-09-09 LAB — FOLATE: Folate: 6.9 ng/mL (ref >3.0)

## 2019-09-09 LAB — VITAMIN B12: Vitamin B-12: 313 pg/mL (ref 232–1245)

## 2019-09-11 ENCOUNTER — Other Ambulatory Visit: Payer: Self-pay

## 2019-09-11 ENCOUNTER — Encounter: Payer: Self-pay | Admitting: Plastic Surgery

## 2019-09-11 ENCOUNTER — Ambulatory Visit (INDEPENDENT_AMBULATORY_CARE_PROVIDER_SITE_OTHER): Payer: BC Managed Care – PPO | Admitting: Plastic Surgery

## 2019-09-11 VITALS — BP 118/68 | HR 69 | Temp 98.0°F

## 2019-09-11 DIAGNOSIS — Z9889 Other specified postprocedural states: Secondary | ICD-10-CM | POA: Diagnosis not present

## 2019-09-11 NOTE — Progress Notes (Signed)
° °  Subjective:    Patient ID: Tina Monroe, female    DOB: 07-Oct-1970, 49 y.o.   MRN: [id number]  Patient is a 49 year old female here for follow-up on her bilateral breast reconstructions.  She underwent bilateral mastectomies with expanders followed by implants.  Her last surgery was March 2020.  She has Mentor ultra high profile 130 cc silicone implants bilaterally.  She is overall pleased with her results but would like some improvements.  Her husband made a comment that she did not look normal.  This did not settle well with her.  She has not had the nipple areolar reconstruction yet.  She is a great candidate for nipple areola tattooing.  She does have some lateral movement of the implants but this is mostly natural and not anything that is concerning.  It could be tacked more medially but would have a very tight look to it and most likely would loosen up in time as well.  She also has loss of some medial volume.  There are no concerning areas for seromas lumps or bumps.  The patient is also in a weight loss program and has just started and is very excited.   Review of Systems  Constitutional: Negative.   HENT: Negative.   Eyes: Negative.   Respiratory: Negative.   Cardiovascular: Negative.   Gastrointestinal: Negative.   Genitourinary: Negative.   Musculoskeletal: Negative.   Hematological: Negative.   Psychiatric/Behavioral: Negative.        Objective:   Physical Exam Vitals and nursing note reviewed.  Constitutional:      Appearance: Normal appearance.  HENT:     Head: Normocephalic and atraumatic.  Cardiovascular:     Rate and Rhythm: Normal rate.     Pulses: Normal pulses.  Pulmonary:     Effort: Pulmonary effort is normal. No respiratory distress.  Abdominal:     General: Abdomen is flat. There is no distension.  Neurological:     General: No focal deficit present.     Mental Status: She is alert and oriented to person, place, and time.  Psychiatric:          Mood and Affect: Mood normal.        Behavior: Behavior normal.        Thought Content: Thought content normal.        Assessment & Plan:     ICD-10-CM   1. S/P breast reconstruction, bilateral  Z98.890     The patient is a great candidate for nipple areola tattooing.  We will go ahead and set her up to talk with [facility].  She could have some fat grafting.  I will definitely see that she can wait until she is further along in the weight loss.  She could end up losing the fat we transfer or having to need a slightly different location of the fat based on her weight change.  The patient is happy with this plan in the meantime we will get her set up for nipple areola tattooing.  Pictures were obtained of the patient and placed in the chart with the patient's or guardian's permission.

## 2019-09-21 ENCOUNTER — Ambulatory Visit (INDEPENDENT_AMBULATORY_CARE_PROVIDER_SITE_OTHER): Payer: Self-pay | Admitting: Family Medicine

## 2019-09-22 ENCOUNTER — Encounter (INDEPENDENT_AMBULATORY_CARE_PROVIDER_SITE_OTHER): Payer: Self-pay | Admitting: Family Medicine

## 2019-09-22 ENCOUNTER — Other Ambulatory Visit: Payer: Self-pay

## 2019-09-22 ENCOUNTER — Ambulatory Visit (INDEPENDENT_AMBULATORY_CARE_PROVIDER_SITE_OTHER): Payer: BC Managed Care – PPO | Admitting: Family Medicine

## 2019-09-22 VITALS — BP 122/78 | HR 62 | Temp 97.6°F | Ht 63.0 in | Wt 182.0 lb

## 2019-09-22 DIAGNOSIS — E8881 Metabolic syndrome: Secondary | ICD-10-CM | POA: Diagnosis not present

## 2019-09-22 DIAGNOSIS — E7849 Other hyperlipidemia: Secondary | ICD-10-CM | POA: Diagnosis not present

## 2019-09-22 DIAGNOSIS — Z9189 Other specified personal risk factors, not elsewhere classified: Secondary | ICD-10-CM | POA: Diagnosis not present

## 2019-09-22 DIAGNOSIS — E559 Vitamin D deficiency, unspecified: Secondary | ICD-10-CM | POA: Diagnosis not present

## 2019-09-22 DIAGNOSIS — G4709 Other insomnia: Secondary | ICD-10-CM

## 2019-09-22 DIAGNOSIS — E88819 Insulin resistance, unspecified: Secondary | ICD-10-CM

## 2019-09-22 DIAGNOSIS — Z6832 Body mass index (BMI) 32.0-32.9, adult: Secondary | ICD-10-CM

## 2019-09-22 DIAGNOSIS — E66811 Obesity, class 1: Secondary | ICD-10-CM

## 2019-09-22 DIAGNOSIS — E669 Obesity, unspecified: Secondary | ICD-10-CM

## 2019-09-22 DIAGNOSIS — F3289 Other specified depressive episodes: Secondary | ICD-10-CM

## 2019-09-22 NOTE — Patient Instructions (Addendum)
  The 10-year ASCVD risk score Mikey Bussing DC Brooke Bonito., et al., 2013) is: 1.1%   Values used to calculate the score:     Age: 49 years     Sex: Female     Is Non-Hispanic African American: No     Diabetic: No     Tobacco smoker: No     Systolic Blood Pressure: 734 mmHg     Is BP treated: No     HDL Cholesterol: 77 mg/dL     Total Cholesterol: 246 mg/dL

## 2019-09-23 NOTE — Progress Notes (Signed)
Chief Complaint:   OBESITY Tina Monroe is here to discuss her progress with her obesity treatment plan along with follow-up of her obesity related diagnoses. Tina Monroe is on the Category 1 Plan and states she is following her eating plan approximately 100% of the time. Tina Monroe states she is exercising for 0 minutes 0 times per week.  Today's visit was #: 2 Starting weight: 186 lbs Starting date: 09/08/2019 Today's weight: 182 lbs Today's date: 09/22/2019 Total lbs lost to date: 4 lbs Total lbs lost since last in-office visit: 4 lbs  Interim History: Tina Monroe says she is tolerating the nutrition plan well.  She has increased her water intake from nothing daily to 100-125 ounces per day.  She says she is less achy and feels better.  Constipation is not a problem since increasing water intake.  She says her hunger is controlled when she follows the plan.  "I felt like it was a lot of food."  She says she did not skip meals and only had one "cheat day".  She ate the smallest cup of ice cream and no potato chips or fries.  She says she ate lean [place].  Thus, she made Tina Monroe and did PC.  She is not getting in all her ounces of protein per day, though.  She loves Yasso bars.  Subjective:   1. Other insomnia [name] has difficulty sleeping.  She says she has had no change in her sleep habits.  2. Other hyperlipidemia Tina Monroe has hyperlipidemia and has been trying to improve her cholesterol levels with intensive lifestyle modification including a low saturated fat diet, exercise and weight loss. She denies any chest pain, claudication or myalgias.  Lab Results  Component Value Date   ALT 17 09/08/2019   AST 18 09/08/2019   ALKPHOS 102 09/08/2019   BILITOT 0.5 09/08/2019   Lab Results  Component Value Date   CHOL 246 (H) 09/08/2019   HDL 77 09/08/2019   LDLCALC 157 (H) 09/08/2019   TRIG 71 09/08/2019   CHOLHDL 3.2 09/08/2019   3. Vitamin D deficiency Tina Monroe's Vitamin D level was 30.4 on  09/08/2019. She is currently taking no vitamin D supplement. She denies nausea, vomiting or muscle weakness.  She endorses fatigue.  4. Insulin resistance Tina Monroe has a diagnosis of insulin resistance based on her elevated fasting insulin level >5. She continues to work on diet and exercise to decrease her risk of diabetes.  Lab Results  Component Value Date   INSULIN 10.3 09/08/2019   Lab Results  Component Value Date   HGBA1C 5.3 09/08/2019   5. Other depression with emotional eating Tina Monroe is struggling with emotional eating and using food for comfort to the extent that it is negatively impacting her health. She has been working on behavior modification techniques to help reduce her emotional eating and has been unsuccessful. She shows no sign of suicidal or homicidal ideations.  "I learned yesterday that when I am bored I think about food and eating."  She is trying to be more thoughtful.  6. At risk for osteoporosis Tina Monroe is at higher risk of osteopenia and osteoporosis due to Vitamin D deficiency.   Assessment/Plan:   1. Other insomnia Discussed labs with patient today.  The problem of recurrent insomnia was discussed. Orders and follow up as documented in patient record. Counseling: Intensive lifestyle modifications are the first line treatment for this issue. We discussed several lifestyle modifications today and she will continue to work on diet, exercise and  weight loss efforts. She will try to avoid caffeine after 3 pm.  Counseling  Limit or avoid alcohol, caffeinated beverages, and cigarettes, especially close to bedtime.   Do not eat a large meal or eat spicy foods right before bedtime. This can lead to digestive discomfort that can make it hard for you to sleep.  Keep a sleep diary to help you and your health care provider figure out what could be causing your insomnia.  . Make your bedroom a dark, comfortable place where it is easy to fall asleep. ? Put up shades or  blackout curtains to block light from outside. ? Use a white noise machine to block noise. ? Keep the temperature cool. . Limit screen use before bedtime. This includes: ? Watching TV. ? Using your smartphone, tablet, or computer. . Stick to a routine that includes going to bed and waking up at the same times every day and night. This can help you fall asleep faster. Consider making a quiet activity, such as reading, part of your nighttime routine. . Try to avoid taking naps during the day so that you sleep better at night. . Get out of bed if you are still awake after 15 minutes of trying to sleep. Keep the lights down, but try reading or doing a quiet activity. When you feel sleepy, go back to bed.  2. Other hyperlipidemia Discussed labs with patient today.  Cardiovascular risk and specific lipid/LDL goals reviewed.  We discussed several lifestyle modifications today and Tina Monroe will continue to work on diet, exercise and weight loss efforts. Orders and follow up as documented in patient record.  Decrease saturated and trans fats, continue prudent nutritional plan, weight loss, and will continue to monitor.  Counseling Intensive lifestyle modifications are the first line treatment for this issue. . Dietary changes: Increase soluble fiber. Decrease simple carbohydrates. . Exercise changes: Moderate to vigorous-intensity aerobic activity 150 minutes per week if tolerated. . Lipid-lowering medications: see documented in medical record.  3. Vitamin D deficiency New.  Discussed labs with patient today.  Low Vitamin D level contributes to fatigue and are associated with obesity, breast, and colon cancer. She agrees to continue to take prescription Vitamin D @50 ,000 IU every week and will follow-up for routine testing of Vitamin D, at least 2-3 times per year to avoid over-replacement. - Vitamin D, Ergocalciferol, (DRISDOL) 1.25 MG (50000 UNIT) CAPS capsule; Take 1 capsule (50,000 Units total) by  mouth every 7 (seven) days.  Dispense: 4 capsule; Refill: 0  4. Insulin resistance New.  Discussed labs with patient today.  Tina Monroe will continue to work on weight loss, exercise, and decreasing simple carbohydrates to help decrease the risk of diabetes. Tina Monroe agreed to follow-up with [name] as directed to closely monitor her progress.  Handouts given after extensive counseling.  Continue prudent nutritional plan and weight loss.  5. Other depression with emotional eating Discussed labs with patient today.  Behavior modification techniques were discussed today to help Tina Monroe deal with her emotional/non-hunger eating behaviors.  Orders and follow up as documented in patient record.  She does not feel like she needs to see Dr. Mallie Mussel.  She says she feels well and is trying to be more mindful.  6. At risk for osteoporosis Tina Monroe was given approximately 15 minutes of osteoporosis prevention counseling today. Tina Monroe is at risk for osteopenia and osteoporosis due to her Vitamin D deficiency. She was encouraged to take her Vitamin D and follow her higher calcium diet and increase strengthening  exercise to help strengthen her bones and decrease her risk of osteopenia and osteoporosis.  Repetitive spaced learning was employed today to elicit superior memory formation and behavioral change.  7. Class 1 obesity with serious comorbidity and body mass index (BMI) of 32.0 to 32.9 in adult, unspecified obesity type Tina Monroe is currently in the action stage of change. As such, her goal is to continue with weight loss efforts. She has agreed to the Category 1 Plan.   Exercise goals: As is.  Behavioral modification strategies: increasing lean protein intake, increasing water intake, no skipping meals and better snacking choices.  Tina Monroe has agreed to follow-up with our clinic in 2 weeks. She was informed of the importance of frequent follow-up visits to maximize her success with intensive lifestyle modifications  for her multiple health conditions.   Objective:   Blood pressure 122/78, pulse 62, temperature 97.6 F (36.4 C), height 5\' 3"  (1.6 m), weight 182 lb (82.6 kg), SpO2 99 %. Body mass index is 32.24 kg/m.  General: Cooperative, alert, well developed, in no acute distress. HEENT: Conjunctivae and lids unremarkable. Cardiovascular: Regular rhythm.  Lungs: Normal work of breathing. Neurologic: No focal deficits.   Lab Results  Component Value Date   CREATININE 0.72 09/08/2019   BUN 9 09/08/2019   NA 141 09/08/2019   K 4.0 09/08/2019   CL 105 09/08/2019   CO2 23 09/08/2019   Lab Results  Component Value Date   ALT 17 09/08/2019   AST 18 09/08/2019   ALKPHOS 102 09/08/2019   BILITOT 0.5 09/08/2019   Lab Results  Component Value Date   HGBA1C 5.3 09/08/2019   Lab Results  Component Value Date   INSULIN 10.3 09/08/2019   Lab Results  Component Value Date   TSH 2.640 09/08/2019   Lab Results  Component Value Date   CHOL 246 (H) 09/08/2019   HDL 77 09/08/2019   LDLCALC 157 (H) 09/08/2019   TRIG 71 09/08/2019   CHOLHDL 3.2 09/08/2019   Lab Results  Component Value Date   WBC 5.1 09/08/2019   HGB 13.5 09/08/2019   HCT 41.8 09/08/2019   MCV 91 09/08/2019   PLT 338 09/08/2019   Attestation Statements:   Reviewed by clinician on day of visit: allergies, medications, problem list, medical history, surgical history, family history, social history, and previous encounter notes.  I, Water quality scientist, CMA, am acting as Location manager for [employer], DO.  I have reviewed the above documentation for accuracy and completeness, and I agree with the above. Mellody Dance, DO

## 2019-09-24 MED ORDER — VITAMIN D (ERGOCALCIFEROL) 1.25 MG (50000 UNIT) PO CAPS: 50000.0000 [IU] | ORAL_CAPSULE | ORAL | 0 refills | Status: DC

## 2019-10-14 ENCOUNTER — Encounter (INDEPENDENT_AMBULATORY_CARE_PROVIDER_SITE_OTHER): Payer: Self-pay | Admitting: Family Medicine

## 2019-10-14 ENCOUNTER — Ambulatory Visit (INDEPENDENT_AMBULATORY_CARE_PROVIDER_SITE_OTHER): Payer: BC Managed Care – PPO | Admitting: Family Medicine

## 2019-10-14 ENCOUNTER — Other Ambulatory Visit: Payer: Self-pay

## 2019-10-14 VITALS — BP 120/80 | HR 61 | Temp 98.0°F | Ht 63.0 in | Wt 177.0 lb

## 2019-10-14 DIAGNOSIS — Z9189 Other specified personal risk factors, not elsewhere classified: Secondary | ICD-10-CM | POA: Diagnosis not present

## 2019-10-14 DIAGNOSIS — E559 Vitamin D deficiency, unspecified: Secondary | ICD-10-CM

## 2019-10-14 DIAGNOSIS — E669 Obesity, unspecified: Secondary | ICD-10-CM

## 2019-10-14 DIAGNOSIS — E7849 Other hyperlipidemia: Secondary | ICD-10-CM

## 2019-10-14 DIAGNOSIS — Z6831 Body mass index (BMI) 31.0-31.9, adult: Secondary | ICD-10-CM

## 2019-10-14 MED ORDER — VITAMIN D (ERGOCALCIFEROL) 1.25 MG (50000 UNIT) PO CAPS: 50000.0000 [IU] | ORAL_CAPSULE | ORAL | 0 refills | Status: DC

## 2019-10-14 NOTE — Progress Notes (Signed)
Chief Complaint:   OBESITY Tina Monroe is here to discuss her progress with her obesity treatment plan along with follow-up of her obesity related diagnoses. Kalecia is on the Category 1 Plan and states she is following her eating plan approximately 90% of the time. Ramiah states she is exercising for 0 minutes 0 times per week.  Today's visit was #: 3 Starting weight: 186 lbs Starting date: 09/08/2019 Today's weight: 177 lbs Today's date: 10/14/2019 Total lbs lost to date: 9 Total lbs lost since last in-office visit: 5 lbs  Interim History: Pier says she is doing well.  Denies any concerns with the plan.  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  She says her hunger and cravings are controlled.  She was started back on tamoxifen by her oncologist recently.  She gained 40 pounds in about 6 months in the past on it.  She does not think that will happen now because she feels very in control of her diet and is very determined to lose the weight.  Subjective:   1. Other hyperlipidemia Tina Monroe has hyperlipidemia and has been trying to improve her cholesterol levels with intensive lifestyle modification including a low saturated fat diet, exercise and weight loss. She denies any chest pain, claudication or myalgias.  Lab Results  Component Value Date   ALT 17 09/08/2019   AST 18 09/08/2019   ALKPHOS 102 09/08/2019   BILITOT 0.5 09/08/2019   Lab Results  Component Value Date   CHOL 246 (H) 09/08/2019   HDL 77 09/08/2019   LDLCALC 157 (H) 09/08/2019   TRIG 71 09/08/2019   CHOLHDL 3.2 09/08/2019   2. Vitamin D deficiency Tina Monroe's Vitamin D level was 30.4 on 09/08/2019. She is currently taking prescription vitamin D 50,000 IU each week. She is tolerating it well.  She denies nausea, vomiting or muscle weakness.  3. At risk for osteoporosis Tina Monroe is at higher risk of osteopenia and osteoporosis due to Vitamin D deficiency and being  postmenopausal.  She knows to get a bone density test per her oncologist's recommendations if they think it is appropriate due to her PMHx.    Assessment/Plan:   1. Other hyperlipidemia Cardiovascular risk and specific lipid/LDL goals reviewed.  We discussed several lifestyle modifications today and Karne will continue to work on diet, exercise and weight loss efforts. Orders and follow up as documented in patient record.   Counseling Intensive lifestyle modifications are the first line treatment for this issue. . Dietary changes: Increase soluble fiber. Decrease simple carbohydrates. . Exercise changes: Moderate to vigorous-intensity aerobic activity 150 minutes per week if tolerated. . Lipid-lowering medications: see documented in medical record.  2. Vitamin D deficiency Low Vitamin D level contributes to fatigue and are associated with obesity, breast, and colon cancer. She agrees to continue to take prescription Vitamin D @50 ,000 IU every week and will follow-up for routine testing of Vitamin D, at least 2-3 times per year to avoid over-replacement.  - Refill Vitamin D, Ergocalciferol, (DRISDOL) 1.25 MG (50000 UNIT) CAPS capsule; Take 1 capsule (50,000 Units total) by mouth every 7 (seven) days.  Dispense: 4 capsule; Refill: 0  3. At risk for osteoporosis Tina Monroe was given approximately 6 minutes of osteoporosis prevention counseling today. Tina Monroe is at risk for osteopenia and osteoporosis due to her Vitamin D deficiency. She was encouraged to take her Vitamin D and follow her higher calcium diet and increase strengthening exercise to  help strengthen her bones and decrease her risk of osteopenia and osteoporosis.  Repetitive spaced learning was employed today to elicit superior memory formation and behavioral change.  4. Class 1 obesity with serious comorbidity and body mass index (BMI) of 31.0 to 31.9 in adult, unspecified obesity type Tina Monroe is currently in the action stage of  change. As such, her goal is to continue with weight loss efforts. She has agreed to the Category 1 Plan.   Exercise goals: Walk for 10-15 minutes per day for help with work/life stressors.  Behavioral modification strategies: increasing lean protein intake, emotional eating strategies and planning for success, and dealing with increased stress.  Use the Calm app and meditate for 10 minutes 2 times per day.     Tina Monroe has agreed to follow-up with our clinic in 2 weeks. She was informed of the importance of frequent follow-up visits to maximize her success with intensive lifestyle modifications for her multiple health conditions.     Objective:   Blood pressure 120/80, pulse 61, temperature 98 F (36.7 C), height 5\' 3"  (1.6 m), weight 177 lb (80.3 kg), SpO2 100 %. Body mass index is 31.35 kg/m.  General: Cooperative, alert, well developed, in no acute distress. HEENT: Conjunctivae and lids unremarkable. Cardiovascular: Regular rhythm.  Lungs: Normal work of breathing. Neurologic: No focal deficits.   Lab Results  Component Value Date   CREATININE 0.72 09/08/2019   BUN 9 09/08/2019   NA 141 09/08/2019   K 4.0 09/08/2019   CL 105 09/08/2019   CO2 23 09/08/2019   Lab Results  Component Value Date   ALT 17 09/08/2019   AST 18 09/08/2019   ALKPHOS 102 09/08/2019   BILITOT 0.5 09/08/2019   Lab Results  Component Value Date   HGBA1C 5.3 09/08/2019   Lab Results  Component Value Date   INSULIN 10.3 09/08/2019   Lab Results  Component Value Date   TSH 2.640 09/08/2019   Lab Results  Component Value Date   CHOL 246 (H) 09/08/2019   HDL 77 09/08/2019   LDLCALC 157 (H) 09/08/2019   TRIG 71 09/08/2019   CHOLHDL 3.2 09/08/2019   Lab Results  Component Value Date   WBC 5.1 09/08/2019   HGB 13.5 09/08/2019   HCT 41.8 09/08/2019   MCV 91 09/08/2019   PLT 338 09/08/2019   Attestation Statements:   Reviewed by clinician on day of visit: allergies, medications,  problem list, medical history, surgical history, family history, social history, and previous encounter notes.  I, Water quality scientist, CMA, am acting as Location manager for [employer], DO.  I have reviewed the above documentation for accuracy and completeness, and I agree with the above. Mellody Dance, DO

## 2019-10-18 ENCOUNTER — Other Ambulatory Visit (INDEPENDENT_AMBULATORY_CARE_PROVIDER_SITE_OTHER): Payer: Self-pay | Admitting: Family Medicine

## 2019-10-18 DIAGNOSIS — E559 Vitamin D deficiency, unspecified: Secondary | ICD-10-CM

## 2019-10-21 ENCOUNTER — Other Ambulatory Visit (INDEPENDENT_AMBULATORY_CARE_PROVIDER_SITE_OTHER): Payer: Self-pay

## 2019-10-21 ENCOUNTER — Encounter (INDEPENDENT_AMBULATORY_CARE_PROVIDER_SITE_OTHER): Payer: Self-pay | Admitting: Family Medicine

## 2019-10-28 ENCOUNTER — Ambulatory Visit (INDEPENDENT_AMBULATORY_CARE_PROVIDER_SITE_OTHER): Payer: BC Managed Care – PPO | Admitting: Family Medicine

## 2019-10-28 ENCOUNTER — Other Ambulatory Visit: Payer: Self-pay

## 2019-10-28 ENCOUNTER — Encounter (INDEPENDENT_AMBULATORY_CARE_PROVIDER_SITE_OTHER): Payer: Self-pay | Admitting: Family Medicine

## 2019-10-28 VITALS — BP 103/70 | HR 63 | Temp 97.6°F | Ht 63.0 in | Wt 176.0 lb

## 2019-10-28 DIAGNOSIS — E669 Obesity, unspecified: Secondary | ICD-10-CM | POA: Diagnosis not present

## 2019-10-28 DIAGNOSIS — G4709 Other insomnia: Secondary | ICD-10-CM

## 2019-10-28 DIAGNOSIS — E559 Vitamin D deficiency, unspecified: Secondary | ICD-10-CM | POA: Diagnosis not present

## 2019-10-28 DIAGNOSIS — E8881 Metabolic syndrome: Secondary | ICD-10-CM | POA: Diagnosis not present

## 2019-10-28 DIAGNOSIS — E66811 Obesity, class 1: Secondary | ICD-10-CM

## 2019-10-28 DIAGNOSIS — Z6831 Body mass index (BMI) 31.0-31.9, adult: Secondary | ICD-10-CM

## 2019-10-28 DIAGNOSIS — E88819 Insulin resistance, unspecified: Secondary | ICD-10-CM

## 2019-10-28 NOTE — Progress Notes (Signed)
Chief Complaint:   OBESITY Yvaine is here to discuss her progress with her obesity treatment plan along with follow-up of her obesity related diagnoses. Jameson is on the Category 1 Plan and states she is following her eating plan approximately 80% of the time. Karlyn states she is exercising for 0 minutes 0 times per week.  Today's visit was #: 4 Starting weight: 186 lbs Starting date: 09/08/2019 Today's weight: 176 lbs Today's date: 11/02/2019 Total lbs lost to date: 10 lbs Total lbs lost since last in-office visit: 1 lb  Interim History: Faylynn says she has been stressed and has eaten poorly or skipped foods/meals.  She was restarted on tamoxifen again and has had increased sugar cravings and decreased sleep due to hot flashes.  She says she is drinking 75-100 ounces of water per day, but has caffeine in her [place].  She used the Calm app for meditation/stress relief that we recommended at the last office visit, and she says it works well and she likes it.  Subjective:   1. Insulin resistance Jessabelle has a diagnosis of insulin resistance based on her elevated fasting insulin level >5. She continues to work on diet and exercise to decrease her risk of diabetes.  Lab Results  Component Value Date   INSULIN 10.3 09/08/2019   Lab Results  Component Value Date   HGBA1C 5.3 09/08/2019   2. Other insomnia Salah can fall asleep, but cannot stay asleep due to increased hot flashes with starting tamoxifen, and there is nothing she can take to stop those symptoms.  3. Vitamin D deficiency Doryce's Vitamin D level was 30.4 on 09/08/2019. She is currently taking prescription vitamin D 50,000 IU each week. She denies nausea, vomiting or muscle weakness.  Denies issues/concerns with medication.  No change in energy level, etc.  Assessment/Plan:   1. Insulin resistance Kalianne will continue to work on weight loss, exercise, and decreasing simple carbohydrates to help  decrease the risk of diabetes. Zykera agreed to follow-up with [name] as directed to closely monitor her progress.  2. Other insomnia She declines a prescription for sleep.  She will try OTC melatonin SR 3-5 mg (10 mg max).  Sleep hygiene and extensive counseling done.  3. Vitamin D deficiency Low Vitamin D level contributes to fatigue and are associated with obesity, breast, and colon cancer. She agrees to continue to take prescription Vitamin D @50 ,000 IU every week and will follow-up for routine testing of Vitamin D, at least 2-3 times per year to avoid over-replacement.  She recently received a refill.  Will check labs in 3 months or so.  4. Class 1 obesity with serious comorbidity and body mass index (BMI) of 31.0 to 31.9 in adult, unspecified obesity type Faiga is currently in the action stage of change. As such, her goal is to continue with weight loss efforts. She has agreed to the Category 1 Plan.   She will add time into her busy schedule as principle to eat.  Also, she will start walking/exercise.  Exercise goals: All adults should avoid inactivity. Some physical activity is better than none, and adults who participate in any amount of physical activity gain some health benefits.  Behavioral modification strategies: increasing lean protein intake, increasing water intake, decreasing caffeine, no skipping meals, emotional eating strategies and planning for success.  Naoma has agreed to follow-up with our clinic in 2 weeks. She was informed of the importance of frequent follow-up visits to maximize her success with  intensive lifestyle modifications for her multiple health conditions.   Objective:   Blood pressure 103/70, pulse 63, temperature 97.6 F (36.4 C), height 5\' 3"  (1.6 m), weight 176 lb (79.8 kg), SpO2 100 %. Body mass index is 31.18 kg/m.  General: Cooperative, alert, well developed, in no acute distress. HEENT: Conjunctivae and lids unremarkable. Cardiovascular:  Regular rhythm.  Lungs: Normal work of breathing. Neurologic: No focal deficits.   Lab Results  Component Value Date   CREATININE 0.72 09/08/2019   BUN 9 09/08/2019   NA 141 09/08/2019   K 4.0 09/08/2019   CL 105 09/08/2019   CO2 23 09/08/2019   Lab Results  Component Value Date   ALT 17 09/08/2019   AST 18 09/08/2019   ALKPHOS 102 09/08/2019   BILITOT 0.5 09/08/2019   Lab Results  Component Value Date   HGBA1C 5.3 09/08/2019   Lab Results  Component Value Date   INSULIN 10.3 09/08/2019   Lab Results  Component Value Date   TSH 2.640 09/08/2019   Lab Results  Component Value Date   CHOL 246 (H) 09/08/2019   HDL 77 09/08/2019   LDLCALC 157 (H) 09/08/2019   TRIG 71 09/08/2019   CHOLHDL 3.2 09/08/2019   Lab Results  Component Value Date   WBC 5.1 09/08/2019   HGB 13.5 09/08/2019   HCT 41.8 09/08/2019   MCV 91 09/08/2019   PLT 338 09/08/2019   Attestation Statements:   Reviewed by clinician on day of visit: allergies, medications, problem list, medical history, surgical history, family history, social history, and previous encounter notes.  Time spent on visit including pre-visit chart review and post-visit care and charting was 30 minutes.   I, Water quality scientist, CMA, am acting as Location manager for [employer], DO.  I have reviewed the above documentation for accuracy and completeness, and I agree with the above. Mellody Dance, DO

## 2019-11-03 ENCOUNTER — Encounter (INDEPENDENT_AMBULATORY_CARE_PROVIDER_SITE_OTHER): Payer: Self-pay | Admitting: Family Medicine

## 2019-11-23 ENCOUNTER — Ambulatory Visit (INDEPENDENT_AMBULATORY_CARE_PROVIDER_SITE_OTHER): Payer: BC Managed Care – PPO

## 2019-11-23 ENCOUNTER — Other Ambulatory Visit: Payer: Self-pay

## 2019-11-23 ENCOUNTER — Encounter (INDEPENDENT_AMBULATORY_CARE_PROVIDER_SITE_OTHER): Payer: Self-pay | Admitting: Family Medicine

## 2019-11-23 ENCOUNTER — Ambulatory Visit (INDEPENDENT_AMBULATORY_CARE_PROVIDER_SITE_OTHER): Payer: BC Managed Care – PPO | Admitting: Family Medicine

## 2019-11-23 VITALS — BP 88/64 | HR 54 | Temp 97.4°F | Ht 63.0 in | Wt 174.0 lb

## 2019-11-23 VITALS — BP 101/64 | HR 68 | Temp 98.5°F | Ht 63.0 in | Wt 171.0 lb

## 2019-11-23 DIAGNOSIS — E669 Obesity, unspecified: Secondary | ICD-10-CM | POA: Diagnosis not present

## 2019-11-23 DIAGNOSIS — Z6830 Body mass index (BMI) 30.0-30.9, adult: Secondary | ICD-10-CM

## 2019-11-23 DIAGNOSIS — Z9889 Other specified postprocedural states: Secondary | ICD-10-CM

## 2019-11-23 DIAGNOSIS — Z719 Counseling, unspecified: Secondary | ICD-10-CM

## 2019-11-23 DIAGNOSIS — G4709 Other insomnia: Secondary | ICD-10-CM

## 2019-11-23 NOTE — Patient Instructions (Signed)
Pt will use vaseline & gauze dressing for approx. 2 weeks She will keep area moisturized  She understands that th area may be dry/scab & may have some bleeding & discomfort She will use OTC tylenol or Ibuprofen as per bottle directions for pain She understands that the color/shades will most likely fade 30-40% & may require touch-up tattoo sessions later. She will call for any concerns or questions F/u in 6 weeks

## 2019-11-23 NOTE — Progress Notes (Signed)
NIPPLE AREOLAR TATTOO PROCEDURE  PREOPERATIVE DIAGNOSIS:  Acquired absence of (BILATERAL) nipple areolar   POSTOPERATIVE DIAGNOSIS: Acquired absence of (BILATERAL) nipple areolar    PROCEDURES: (BILATERAL) nipple areolar tattoo   ATTENDING SURGEON: Dr. Lyndee Leo   [place]  ANESTHESIA:  EMLA applied 30 mins prior to procedure  COMPLICATIONS: None.  JUSTIFICATION FOR PROCEDURE:  Ms. Arreaga is a 49 y.o. female with a history of breast cancer status post bilateral breast reconstruction. The patient presents for bilateral nipple areolar complex tattoo. Risks, benefits, indications, and alternatives of the above described procedures were discussed with the patient and all the patient's questions were answered.   DESCRIPTION OF PROCEDURE: After informed consent was obtained and proper identification of patient and surgical site was made, the patient was taken to the procedure room and pre-procedure photos were obtained & entered into chart. Placement/size & colors were decided by pt. Pt was then  placed supine on the operating room table. A time out was performed to confirm patient's identity and surgical site. The patient was prepped and draped in the usual sterile fashion.   Using a # 7 tattoo head, pigment was instilled to the designed nipple areolar complex.  Once adequate pigment had been applied to the sites A post-procedure photos taken & entered into pt's chart.  vaseline & gauze dressing was applied.  The patient tolerated the procedure well.  Post-procedure care reviewed & given to pt  Digital Pop tattoo machine used with the following Ink: Cool Mink, Tan Peach, [place] Lisa Skin, [place], [place] Honey Duration/topical anesthesia used as needed Lot#'s & exp dates on file

## 2019-11-24 NOTE — Progress Notes (Signed)
Chief Complaint:   OBESITY Tina Monroe is here to discuss her progress with her obesity treatment plan along with follow-up of her obesity related diagnoses. Tina Monroe is on the Category 1 Plan and states she is following her eating plan approximately 90% of the time. Tina Monroe states she is exercising for 0 minutes 0 times per week.  Today's visit was #: 5 Starting weight: 186 lbs Starting date: 09/08/2019 Today's weight: 171 lbs Today's date: 11/23/2019 Total lbs lost to date: 15 lbs Total lbs lost since last in-office visit: 5 lbs  Interim History: Tina Monroe has done more celebration eating lately.  She cannot believe she lost weight.  Denies issues with the plan.  She says she likes it.  Hunger and cravings are controlled.  For snack calories, she is having [place] bars nightly.  She plans on going to the gym with her neighbor in the near future.  Assessment/Plan:   1. Other insomnia She tried melatonin and says it did not help.  She says it made her more energetic, "jittery even".  She feels the Calm app is working!  She has been meditating with it, which is helping her sleep.  Denies concerns.  Plan:  Continue Calm app and doing sleep medication nightly.  Also, she will start exercising 3 days per week to help her sleep.  She denies need for medications, etc.  She is doing well.  2. Class 1 obesity with serious comorbidity and body mass index (BMI) of 30.0 to 30.9 in adult, unspecified obesity type  Tina Monroe is currently in the action stage of change. As such, her goal is to continue with weight loss efforts. She has agreed to the Category 1 Plan.   Exercise goals: GOAL:  Walking 35-40 minutes per day 3 days per week.  Behavioral modification strategies: increasing lean protein intake, increasing water intake, meal planning and cooking strategies, keeping healthy foods in the home, ways to avoid night time snacking and planning for success.  Tina Monroe has agreed to follow-up with our  clinic in 2-3 weeks. She was informed of the importance of frequent follow-up visits to maximize her success with intensive lifestyle modifications for her multiple health conditions.    Objective:   Blood pressure 101/64, pulse 68, temperature 98.5 F (36.9 C), height 5\' 3"  (1.6 m), weight 171 lb (77.6 kg), SpO2 97 %. Body mass index is 30.29 kg/m.  General: Cooperative, alert, well developed, in no acute distress. HEENT: Conjunctivae and lids unremarkable. Cardiovascular: Regular rhythm.  Lungs: Normal work of breathing. Neurologic: No focal deficits.   Lab Results  Component Value Date   CREATININE 0.72 09/08/2019   BUN 9 09/08/2019   NA 141 09/08/2019   K 4.0 09/08/2019   CL 105 09/08/2019   CO2 23 09/08/2019   Lab Results  Component Value Date   ALT 17 09/08/2019   AST 18 09/08/2019   ALKPHOS 102 09/08/2019   BILITOT 0.5 09/08/2019   Lab Results  Component Value Date   HGBA1C 5.3 09/08/2019   Lab Results  Component Value Date   INSULIN 10.3 09/08/2019   Lab Results  Component Value Date   TSH 2.640 09/08/2019   Lab Results  Component Value Date   CHOL 246 (H) 09/08/2019   HDL 77 09/08/2019   LDLCALC 157 (H) 09/08/2019   TRIG 71 09/08/2019   CHOLHDL 3.2 09/08/2019   Lab Results  Component Value Date   WBC 5.1 09/08/2019   HGB 13.5 09/08/2019   HCT  41.8 09/08/2019   MCV 91 09/08/2019   PLT 338 09/08/2019   Attestation Statements:   Reviewed by clinician on day of visit: allergies, medications, problem list, medical history, surgical history, family history, social history, and previous encounter notes.  Time spent on visit including pre-visit chart review and post-visit care and charting was 21 minutes.   I, Water quality scientist, CMA, am acting as Location manager for [employer], DO.  I have reviewed the above documentation for accuracy and completeness, and I agree with the above. Mellody Dance, DO

## 2019-11-25 ENCOUNTER — Telehealth: Payer: Self-pay | Admitting: Plastic Surgery

## 2019-11-25 NOTE — Telephone Encounter (Signed)
Patient would like to speak to [facility] regarding her NAC tattoo on Monday. Please call her to discuss. [phone]

## 2019-12-01 ENCOUNTER — Telehealth: Payer: Self-pay

## 2019-12-01 NOTE — Telephone Encounter (Signed)
11/25/19 Returned pt's call regarding her concern with bilateral nipple tattoo. Pt is status/post bilateral NAC tattoo with me on 11/23/19  Pt called on 11/25/19 She reports that both nipples/areola areas have lost most of the color & are red/slight swelling & tender. She described that the color/ink was noted on her gauze dressings when she changed the dressing. She is using vaseline & gauze. She denies any other symptoms- no fever/chills or other complications I informed her that during the healing process- the area will be sensitive/swelling/& will be reactive to the procedure, and typically some of the ink/color can be expelled onto the dressing. I explained that the color deposited at a deeper level may appear later once the inflamed/healing process is complete. She will continue to treat the area with vaseline/gauze & monitor the progress. I asked her to call the office & keep me updated with her symptoms & she will call for any concerns or if she needs to be seen sooner. Pt agrees with plan.

## 2019-12-01 NOTE — Telephone Encounter (Signed)
See note from 11/25/19

## 2019-12-08 ENCOUNTER — Ambulatory Visit: Payer: BC Managed Care – PPO | Admitting: Plastic Surgery

## 2019-12-08 ENCOUNTER — Other Ambulatory Visit: Payer: Self-pay

## 2019-12-08 ENCOUNTER — Encounter: Payer: Self-pay | Admitting: Plastic Surgery

## 2019-12-08 VITALS — BP 124/82 | HR 56 | Temp 97.8°F

## 2019-12-08 DIAGNOSIS — Z9013 Acquired absence of bilateral breasts and nipples: Secondary | ICD-10-CM | POA: Diagnosis not present

## 2019-12-08 NOTE — Progress Notes (Signed)
   Subjective:    Patient ID: Tina Monroe, female    DOB: 1970/11/08, 49 y.o.   MRN: [id number]  The patient is a 49 year old female here for follow-up on her breast reconstruction.  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  Her preoperative bra size was a 36 B/C.  She has not had any radiation.  She had bilateral mastectomies in January 2020.  She then had reconstruction with expanders and implant placement with her last surgery and March 2020.  Her current implants are Mentor ultra high profile 561 cc silicone implants.  She has started her nipple areola tattoo reconstruction.  She has a family member that was diagnosed with breast cancer and had mastectomies 6 years ago.  Recently she was found to have another cancer.  This is created some concern for her.  She does not have any lumps or bumps of concern.  She does not like the way that the implants move laterally when she lays down.  They appear to be normal and have good position and movement.  Her incisions are nicely healed.  She [facility].  She has lost 16 pounds.     Review of Systems  Constitutional: Negative.   HENT: Negative.   Eyes: Negative.   Respiratory: Negative.   Cardiovascular: Negative.  Negative for leg swelling.  Gastrointestinal: Negative.  Negative for abdominal distention.  Endocrine: Negative.   Genitourinary: Negative.   Hematological: Negative.        Objective:   Physical Exam Vitals and nursing note reviewed.  Constitutional:      Appearance: Normal appearance.  HENT:     Head: Normocephalic and atraumatic.  Cardiovascular:     Rate and Rhythm: Normal rate.     Pulses: Normal pulses.  Pulmonary:     Effort: Pulmonary effort is normal.  Abdominal:     General: Abdomen is flat. There is no distension.  Neurological:      General: No focal deficit present.     Mental Status: She is alert and oriented to person, place, and time.  Psychiatric:        Mood and Affect: Mood normal.        Behavior: Behavior normal.        Thought Content: Thought content normal.         Assessment & Plan:     ICD-10-CM   1. Acquired absence of breast and absent nipple, bilateral  Z90.13     Fat filling is an option for the patient to improve the fullness in the medial aspect.  This will not prevent the lateral movement of the implants.  She understands this and would like to wait and see how she does over the next few months.  We will plan to see her back whenever she is ready but at least at 1 year which puts her to November of next year.  Pictures were obtained of the patient and placed in the chart with the patient's or guardian's permission.

## 2019-12-10 DIAGNOSIS — C50511 Malignant neoplasm of lower-outer quadrant of right female breast: Secondary | ICD-10-CM

## 2019-12-14 ENCOUNTER — Ambulatory Visit (INDEPENDENT_AMBULATORY_CARE_PROVIDER_SITE_OTHER): Payer: BC Managed Care – PPO | Admitting: Family Medicine

## 2019-12-14 ENCOUNTER — Other Ambulatory Visit: Payer: Self-pay

## 2019-12-14 ENCOUNTER — Encounter (INDEPENDENT_AMBULATORY_CARE_PROVIDER_SITE_OTHER): Payer: Self-pay | Admitting: Family Medicine

## 2019-12-14 VITALS — BP 115/74 | HR 87 | Temp 87.0°F | Ht 63.0 in | Wt 168.0 lb

## 2019-12-14 DIAGNOSIS — E559 Vitamin D deficiency, unspecified: Secondary | ICD-10-CM | POA: Diagnosis not present

## 2019-12-14 DIAGNOSIS — E538 Deficiency of other specified B group vitamins: Secondary | ICD-10-CM | POA: Diagnosis not present

## 2019-12-14 DIAGNOSIS — Z9189 Other specified personal risk factors, not elsewhere classified: Secondary | ICD-10-CM

## 2019-12-14 DIAGNOSIS — E8881 Metabolic syndrome: Secondary | ICD-10-CM

## 2019-12-14 DIAGNOSIS — F3289 Other specified depressive episodes: Secondary | ICD-10-CM

## 2019-12-14 DIAGNOSIS — F32A Depression, unspecified: Secondary | ICD-10-CM | POA: Insufficient documentation

## 2019-12-14 DIAGNOSIS — E669 Obesity, unspecified: Secondary | ICD-10-CM

## 2019-12-14 DIAGNOSIS — E7849 Other hyperlipidemia: Secondary | ICD-10-CM

## 2019-12-14 DIAGNOSIS — Z6830 Body mass index (BMI) 30.0-30.9, adult: Secondary | ICD-10-CM

## 2019-12-14 MED ORDER — VITAMIN D (ERGOCALCIFEROL) 1.25 MG (50000 UNIT) PO CAPS: 50000.0000 [IU] | ORAL_CAPSULE | ORAL | 0 refills | Status: DC

## 2019-12-15 LAB — LIPID PANEL
Chol/HDL Ratio: 2.8 ratio (ref 0.0–4.4)
Cholesterol, Total: 204 mg/dL — ABNORMAL HIGH (ref 100–199)
HDL: 72 mg/dL (ref >39)
LDL Chol Calc (NIH): 115 mg/dL — ABNORMAL HIGH (ref 0–99)
Triglycerides: 95 mg/dL (ref 0–149)
VLDL Cholesterol Cal: 17 mg/dL (ref 5–40)

## 2019-12-15 LAB — INSULIN, RANDOM: INSULIN: 9 u[IU]/mL (ref 2.6–24.9)

## 2019-12-15 LAB — VITAMIN B12: Vitamin B-12: 471 pg/mL (ref 232–1245)

## 2019-12-15 LAB — VITAMIN D 25 HYDROXY (VIT D DEFICIENCY, FRACTURES): Vit D, 25-Hydroxy: 50.1 ng/mL (ref 30.0–100.0)

## 2019-12-16 NOTE — Progress Notes (Signed)
Chief Complaint:   OBESITY Tina Monroe is here to discuss her progress with her obesity treatment plan along with follow-up of her obesity related diagnoses. Tina Monroe is on the Category 1 Plan and states she is following her eating plan approximately 80-85% of the time. Tina Monroe states she is walking for 30 minutes 2 times per week.  Today's visit was #: 6 Starting weight: 186 lbs Starting date: 09/08/2019 Today's weight: 168 lbs Today's date: 12/14/2019 Total lbs lost to date: 18 lbs Total lbs lost since last in-office visit: 3 lbs Total weight loss percentage to date: -9.68%  Interim History: Tina Monroe says she has had more family stressors, but despite this, she says that she has been following the plan.  She says that she even went to [employer] and ate 3 bread sticks, but otherwise ate salmon and broccoli.  She feels she needs to exercise more.  It has been over 3 months since her last labs, she she has lost 10% of her original body weight (18 pounds).  Assessment/Plan:   Meds ordered this encounter  Medications  . Vitamin D, Ergocalciferol, (DRISDOL) 1.25 MG (50000 UNIT) CAPS capsule    Sig: Take 1 capsule (50,000 Units total) by mouth every 7 (seven) days.    Dispense:  4 capsule    Refill:  0   Orders Placed This Encounter  Procedures  . VITAMIN D 25 Hydroxy (Vit-D Deficiency, Fractures)  . Vitamin B12  . Lipid panel  . Insulin, random    1. Vitamin D deficiency Tina Monroe has a history of Vitamin D deficiency with resultant generalized fatigue as her primary symptom.  she is taking vitamin D 50,000 IU weekly for this deficiency and tolerating it well without side-effect.  Most recent Vitamin D lab reviewed-  level: 30.4 on 09/08/2019.  She endorses fatigue and says stressors are the most likely cause.  Plan:   - Discussed importance of vitamin D (as well as calcium) to their health and wellbeing.   - We reviewed possible symptoms of low Vitamin D  including low energy, depressed mood, muscle aches, joint aches, osteoporosis, etc.  - We discussed that low Vitamin D levels may be linked to an increased risk of cardiovascular events, and even increased risk of cancers, such as colon and breast.   - Educated pt that weight loss will likely improve availability of vitamin D, thus encouraged Jaysha to continue with meal plan and their weight loss efforts to further improve this condition.  - I recommend pt take weekly prescription vit D 50,000 IU - see script below- which pt agrees to after discussion of the risks and benefits of this medication.      - Informed patient this may be a lifelong thing, and she was encouraged to continue to take the medicine until told otherwise.  We will need to monitor levels regularly (every 3-4 mo on average) to keep levels within normal limits.   - All pt's questions and concerns regarding this condition addressed.  -Refill Refill Vitamin D, Ergocalciferol, (DRISDOL) 1.25 MG (50000 UNIT) CAPS capsule; Take 1 capsule (50,000 Units total) by mouth every 7 (seven) days.  Dispense: 4 capsule; Refill: 0 - VITAMIN D 25 Hydroxy (Vit-D Deficiency, Fractures)  2. Other hyperlipidemia Tina Monroe has hyperlipidemia and has been trying to improve her cholesterol levels with intensive lifestyle modification including a low saturated fat diet, exercise and weight loss. She denies any chest pain, claudication or myalgias.  She is not  taking any cholesterol medication.  HLD is diet controlled at this time.  Plan:  Continue to reduce sat/trans fats in diet through prudent nutritional plan.  Continue weight loss.   Will check FLP today.  Lab Results  Component Value Date   ALT 17 09/08/2019   AST 18 09/08/2019   ALKPHOS 102 09/08/2019   BILITOT 0.5 09/08/2019   - Lipid panel  3. Insulin resistance Tina Monroe has a diagnosis of insulin resistance based on her elevated fasting insulin level >5. She continues to work on diet  and exercise to decrease her risk of diabetes.  Plan:  Continue prudent nutritional plan and weight loss.  Check fasting insulin level today.  Lab Results  Component Value Date   INSULIN 10.3 09/08/2019   Lab Results  Component Value Date   HGBA1C 5.3 09/08/2019   - Insulin, random  4. B12 deficiency The diagnosis was reviewed with the patient. Counseling provided today, see below. We will continue to monitor. Orders and follow up as documented in patient record.  Vitamin B12 level was 313 on 09/08/2019.  She is not taking a vitamin B12 supplement.  Plan:  Check vitamin B12 level today.  Continue prudent nutritional plan and weight loss.  - Vitamin B12  5. Other depression with emotional eating Tina Monroe is worried because her cousin's breast cancer came back and genetically they have the same type of cancer.    Plan:  Increase exercise to help with stress, which will in turn help with emotional eating.  6. At risk for osteoporosis Tina Monroe was given approximately 9 minutes of osteoporosis prevention counseling today.   Female is at risk for osteopenia and osteoporosis due to Vitamin D deficiency, as well as other risk factors.  We discussed the importance of prudent screenings through her PCP's office for prevention.     Tina Monroe was encouraged to take her Vitamin D and follow her calcium rich diet.  We will continue to monitor vitamin D levels to ensure treatment is appropriate.   It is recommended that she eventually engage in weight bearing exercises and muscle strengthening exercises to help improve bone density and decrease her risk of osteopenia and osteoporosis.  7. Class 1 obesity with serious comorbidity and body mass index (BMI) of 30.0 to 30.9 in adult, unspecified obesity type  Tina Monroe is currently in the action stage of change. As such, her goal is to continue with weight loss efforts. She has agreed to the Category 1 Plan.   Exercise goals: For substantial health benefits,  adults should do at least 150 minutes (2 hours and 30 minutes) a week of moderate-intensity, or 75 minutes (1 hour and 15 minutes) a week of vigorous-intensity aerobic physical activity, or an equivalent combination of moderate- and vigorous-intensity aerobic activity. Aerobic activity should be performed in episodes of at least 10 minutes, and preferably, it should be spread throughout the week. Exercise for 30-45 minutes 5 days per week.  Behavioral modification strategies: keeping healthy foods in the home and emotional eating strategies.  Tina Monroe has agreed to follow-up with our clinic in 2 weeks. She was informed of the importance of frequent follow-up visits to maximize her success with intensive lifestyle modifications for her multiple health conditions.   Tina Monroe was informed we would discuss her lab results at her next visit unless there is a critical issue that needs to be addressed sooner. Tina Monroe agreed to keep her next visit at the agreed upon time to discuss these results.  Objective:  Blood pressure 115/74, pulse 87, temperature (!) 87 F (30.6 C), height 5\' 3"  (1.6 m), weight 168 lb (76.2 kg), SpO2 97 %. Body mass index is 29.76 kg/m.  General: Cooperative, alert, well developed, in no acute distress. HEENT: Conjunctivae and lids unremarkable. Cardiovascular: Regular rhythm.  Lungs: Normal work of breathing. Neurologic: No focal deficits.   Lab Results  Component Value Date   CREATININE 0.72 09/08/2019   BUN 9 09/08/2019   NA 141 09/08/2019   K 4.0 09/08/2019   CL 105 09/08/2019   CO2 23 09/08/2019   Lab Results  Component Value Date   ALT 17 09/08/2019   AST 18 09/08/2019   ALKPHOS 102 09/08/2019   BILITOT 0.5 09/08/2019   Lab Results  Component Value Date   HGBA1C 5.3 09/08/2019   Lab Results  Component Value Date   INSULIN 9.0 12/14/2019   INSULIN 10.3 09/08/2019   Lab Results  Component Value Date   TSH 2.640 09/08/2019   Lab Results  Component  Value Date   CHOL 204 (H) 12/14/2019   HDL 72 12/14/2019   LDLCALC 115 (H) 12/14/2019   TRIG 95 12/14/2019   CHOLHDL 2.8 12/14/2019   Lab Results  Component Value Date   WBC 5.1 09/08/2019   HGB 13.5 09/08/2019   HCT 41.8 09/08/2019   MCV 91 09/08/2019   PLT 338 09/08/2019   Attestation Statements:   Reviewed by clinician on day of visit: allergies, medications, problem list, medical history, surgical history, family history, social history, and previous encounter notes.  I, Water quality scientist, CMA, am acting as Location manager for [employer], DO.  I have reviewed the above documentation for accuracy and completeness, and I agree with the above. Marjory Sneddon, D.O.  The [place] was signed into law in 2016 which includes the topic of electronic health records.  This provides immediate access to information in MyChart.  This includes consultation notes, operative notes, office notes, lab results and pathology reports.  If you have any questions about what you read please let us know at your next visit so we can discuss your concerns and take corrective action if need be.  We are right here with you.

## 2019-12-24 ENCOUNTER — Other Ambulatory Visit: Payer: Self-pay | Admitting: Hematology and Oncology

## 2019-12-24 MED ORDER — TAMOXIFEN CITRATE 20 MG PO TABS
20.0000 mg | ORAL_TABLET | Freq: Every day | ORAL | 3 refills | Status: DC
Start: 2019-12-24 — End: 2021-01-10

## 2019-12-31 ENCOUNTER — Ambulatory Visit (INDEPENDENT_AMBULATORY_CARE_PROVIDER_SITE_OTHER): Payer: BC Managed Care – PPO | Admitting: Family Medicine

## 2020-01-13 ENCOUNTER — Other Ambulatory Visit: Payer: Self-pay

## 2020-01-13 ENCOUNTER — Ambulatory Visit (INDEPENDENT_AMBULATORY_CARE_PROVIDER_SITE_OTHER): Payer: BC Managed Care – PPO

## 2020-01-13 ENCOUNTER — Ambulatory Visit (INDEPENDENT_AMBULATORY_CARE_PROVIDER_SITE_OTHER): Payer: BC Managed Care – PPO | Admitting: Family Medicine

## 2020-01-13 ENCOUNTER — Encounter (INDEPENDENT_AMBULATORY_CARE_PROVIDER_SITE_OTHER): Payer: Self-pay | Admitting: Family Medicine

## 2020-01-13 VITALS — BP 113/74 | HR 58 | Temp 98.0°F | Ht 63.0 in | Wt 167.0 lb

## 2020-01-13 VITALS — BP 123/76 | HR 82 | Temp 98.0°F | Ht 63.0 in | Wt 168.0 lb

## 2020-01-13 DIAGNOSIS — E8881 Metabolic syndrome: Secondary | ICD-10-CM | POA: Diagnosis not present

## 2020-01-13 DIAGNOSIS — E7849 Other hyperlipidemia: Secondary | ICD-10-CM | POA: Diagnosis not present

## 2020-01-13 DIAGNOSIS — Z9013 Acquired absence of bilateral breasts and nipples: Secondary | ICD-10-CM

## 2020-01-13 DIAGNOSIS — Z6830 Body mass index (BMI) 30.0-30.9, adult: Secondary | ICD-10-CM

## 2020-01-13 DIAGNOSIS — E669 Obesity, unspecified: Secondary | ICD-10-CM | POA: Diagnosis not present

## 2020-01-13 DIAGNOSIS — E559 Vitamin D deficiency, unspecified: Secondary | ICD-10-CM

## 2020-01-13 DIAGNOSIS — Z9189 Other specified personal risk factors, not elsewhere classified: Secondary | ICD-10-CM

## 2020-01-13 MED ORDER — VITAMIN D (ERGOCALCIFEROL) 1.25 MG (50000 UNIT) PO CAPS: 50000.0000 [IU] | ORAL_CAPSULE | ORAL | 0 refills | Status: DC

## 2020-01-18 NOTE — Progress Notes (Signed)
Chief Complaint:   OBESITY Tina Monroe is here to discuss her progress with her obesity treatment plan along with follow-up of her obesity related diagnoses. Tina Monroe is on the Category 1 Plan and states she is following her eating plan approximately 85% of the time. Florella states she is walking for 35 minutes 3 times per week.  Today's visit was #: 7 Starting weight: 186 lbs Starting date: 09/08/2019 Today's weight: 167 lbs Today's date: 01/13/2020 Total lbs lost to date: 19 lbs Total lbs lost since last in-office visit: 1 lb Total weight loss percentage to date: -10.22%  Interim History: Tina Monroe says she has been eating on plan, but at work she has been doing more snacking/treats.  Also, has been almost 1 month from prior office visit on 12/17/2019.  For snack calories, she is having [place] or nuts or a cheese stick.  She had labs then and she is here to review them with Tina Monroe today.  Assessment/Plan:   1. Vitamin D deficiency Discussed labs with patient today.  Almeda's Vitamin D level was 50.1 on 12/14/2019. She is currently taking prescription vitamin D 50,000 IU each week. She denies nausea, vomiting or muscle weakness.  She is tolerating the supplement well without side effects.  Plan:   - Discussed importance of vitamin D to their health and well-being.  - possible symptoms of low Vitamin D can be low energy, depressed mood, muscle aches, joint aches, osteoporosis etc. - low Vitamin D levels may be linked to an increased risk of cardiovascular events and even increased risk of cancers- such as colon and breast.  - I recommend pt take a weekly prescription vit D - see script below   - Informed patient this may be a lifelong thing, and she was encouraged to continue to take the medicine until told otherwise.   - we will need to monitor levels regularly (every 3-4 mo on average) to keep levels within normal limits.  - weight loss will likely improve availability of vitamin D, thus  encouraged Dorma to continue with meal plan and their weight loss efforts to further improve this condition - pt's questions and concerns regarding this condition addressed.  -Refill Vitamin D, Ergocalciferol, (DRISDOL) 1.25 MG (50000 UNIT) CAPS capsule; Take 1 capsule (50,000 Units total) by mouth every 7 (seven) days.  Dispense: 4 capsule; Refill: 0  2. Other hyperlipidemia Discussed labs with patient today.  [name] has hyperlipidemia and has been trying to improve her cholesterol levels with intensive lifestyle modification including a low saturated fat diet, exercise and weight loss. She denies any chest pain, claudication or myalgias.  No medications.  Diet controlled.  Plan:  Improved labs from prior.  Decreased LDL, HDL >60 still.  Lab Results  Component Value Date   ALT 17 09/08/2019   AST 18 09/08/2019   ALKPHOS 102 09/08/2019   BILITOT 0.5 09/08/2019   Lab Results  Component Value Date   CHOL 204 (H) 12/14/2019   HDL 72 12/14/2019   LDLCALC 115 (H) 12/14/2019   TRIG 95 12/14/2019   CHOLHDL 2.8 12/14/2019   3. Insulin resistance Discussed labs with patient today.  [name] has a diagnosis of insulin resistance based on her elevated fasting insulin level >5. She continues to work on diet and exercise to decrease her risk of diabetes.  Plan:  Improved from prior.  Lab Results  Component Value Date   INSULIN 9.0 12/14/2019   INSULIN 10.3 09/08/2019   Lab Results  Component Value  Date   HGBA1C 5.3 09/08/2019   4. At risk for osteoporosis [name] was given approximately 9 minutes of osteoporosis prevention counseling today.  Tina Monroe is at risk for osteopenia and osteoporosis due to Vitamin D deficiency, as well as other risk factors.  We discussed the importance of prudent screenings through her PCP's office for prevention.  Tina Monroe was encouraged to take her Vitamin D and follow her calcium rich diet.  We will continue to monitor vitamin D levels to ensure treatment is  appropriate.  It is recommended that she eventually engage in weight bearing exercises and muscle strengthening exercises to help improve bone density and decrease her risk of osteopenia and osteoporosis.  5. Class 1 obesity with serious comorbidity and body mass index (BMI) of 30.0 to 30.9 in adult, unspecified obesity type  [name] is currently in the action stage of change. As such, her goal is to continue with weight loss efforts. She has agreed to the Category 1 Plan.   Exercise goals: For substantial health benefits, adults should do at least 150 minutes (2 hours and 30 minutes) a week of moderate-intensity, or 75 minutes (1 hour and 15 minutes) a week of vigorous-intensity aerobic physical activity, or an equivalent combination of moderate- and vigorous-intensity aerobic activity. Aerobic activity should be performed in episodes of at least 10 minutes, and preferably, it should be spread throughout the week.  Increase to minimum 150 minutes per week.  Behavioral modification strategies: increasing lean protein intake, decreasing simple carbohydrates, meal planning and cooking strategies, holiday eating strategies , celebration eating strategies and planning for success.  Tina Monroe has agreed to follow-up with our clinic in 2-3 weeks. She was informed of the importance of frequent follow-up visits to maximize her success with intensive lifestyle modifications for her multiple health conditions.   Objective:   Blood pressure 113/74, pulse (!) 58, temperature 98 F (36.7 C), height 5\' 3"  (1.6 m), weight 167 lb (75.8 kg), SpO2 99 %. Body mass index is 29.58 kg/m.  General: Cooperative, alert, well developed, in no acute distress. HEENT: Conjunctivae and lids unremarkable. Cardiovascular: Regular rhythm.  Lungs: Normal work of breathing. Neurologic: No focal deficits.   Lab Results  Component Value Date   CREATININE 0.72 09/08/2019   BUN 9 09/08/2019   NA 141 09/08/2019   K 4.0  09/08/2019   CL 105 09/08/2019   CO2 23 09/08/2019   Lab Results  Component Value Date   ALT 17 09/08/2019   AST 18 09/08/2019   ALKPHOS 102 09/08/2019   BILITOT 0.5 09/08/2019   Lab Results  Component Value Date   HGBA1C 5.3 09/08/2019   Lab Results  Component Value Date   INSULIN 9.0 12/14/2019   INSULIN 10.3 09/08/2019   Lab Results  Component Value Date   TSH 2.640 09/08/2019   Lab Results  Component Value Date   CHOL 204 (H) 12/14/2019   HDL 72 12/14/2019   LDLCALC 115 (H) 12/14/2019   TRIG 95 12/14/2019   CHOLHDL 2.8 12/14/2019   Lab Results  Component Value Date   WBC 5.1 09/08/2019   HGB 13.5 09/08/2019   HCT 41.8 09/08/2019   MCV 91 09/08/2019   PLT 338 09/08/2019   Attestation Statements:   Reviewed by clinician on day of visit: allergies, medications, problem list, medical history, surgical history, family history, social history, and previous encounter notes.  I, Water quality scientist, CMA, am acting as Location manager for [employer], DO.  I have reviewed the above documentation for  accuracy and completeness, and I agree with the above. Marjory Sneddon, D.O.  The [place] was signed into law in 2016 which includes the topic of electronic health records.  This provides immediate access to information in MyChart.  This includes consultation notes, operative notes, office notes, lab results and pathology reports.  If you have any questions about what you read please let Tina Monroe know at your next visit so we can discuss your concerns and take corrective action if need be.  We are right here with you.

## 2020-01-28 ENCOUNTER — Ambulatory Visit (INDEPENDENT_AMBULATORY_CARE_PROVIDER_SITE_OTHER): Payer: BC Managed Care – PPO | Admitting: Family Medicine

## 2020-02-09 ENCOUNTER — Ambulatory Visit (INDEPENDENT_AMBULATORY_CARE_PROVIDER_SITE_OTHER): Payer: BC Managed Care – PPO | Admitting: Family Medicine

## 2020-02-10 NOTE — Patient Instructions (Signed)
Pt will keep areas covered with vaseline/xeroform & 4x4 gauze dressing for approx 2 weeks. She understands that ink/colors will fade requiring possible future touch-ups. She will call for any concerns or questions

## 2020-02-10 NOTE — Progress Notes (Signed)
NIPPLE AREOLAR TATTOO PROCEDURE  PREOPERATIVE DIAGNOSIS:  Acquired absence of (BILATERAL) nipple areolar   POSTOPERATIVE DIAGNOSIS: Acquired absence of (BILATERAL) nipple areolar    PROCEDURES: (BILATERAL)nipple areolar tattoo   ATTENDING SURGEON: Dr. Lyndee Leo Dillingham   ANESTHESIA:  EMLA  COMPLICATIONS: None.  JUSTIFICATION FOR PROCEDURE:  Ms. Foye is a 49 y.o. female with a history of breast cancer status post bilateral breast reconstruction. The patient presents for nipple areolar complex tattoo touch-up.  Risks, benefits, indications, and alternatives of the above described procedures were discussed with the patient and all the patient's questions were answered.   DESCRIPTION OF PROCEDURE: After informed consent was obtained and proper identification of patient and surgical site was made, the patient was taken to the procedure room and pre-procedure photos were taken & entered into chart. Patient was  placed supine on the operating room table. A time out was performed to confirm patient's identity and surgical site. The patient was prepped and draped in the usual sterile fashion.   Using a #7 tattoo head, pigment was instilled to the designed nipple areolar complex.   Once adequate pigment had been applied to the nipple areolar complex, a post-procedure photo was taken. vaseline, xeroform & 4x4 gauze dressing was applied. The patient tolerated the procedure well.  Post-care instructions reviewed with pt. World Famous Tattoo Ink used: [place]/ Fair Peach/ Fair Honey/ Warm Honey Duration used as needed for pain control during procedure. Lot #'s & expiration dates on record

## 2020-03-10 NOTE — Progress Notes (Signed)
[name]  [address] [place],    [address] [phone]  Clinic Day:  03/11/2020  Referring physician: Maggie Schwalbe, PA-C   This document serves as a record of services personally performed by [name], MD. It was created on their behalf by Curry,Lauren E, a trained medical scribe. The creation of this record is based on the scribe's personal observations and the provider's statements to them.   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   HISTORY OF PRESENT ILLNESS:  Tina Monroe is a 50 y.o. 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  She had a previous right breast biopsy in August 2017, which revealed a fibroadenoma.  She has a family history of breast cancer and her sister was found to have a mutation in the ATM gene, so the patient underwent genetic testing in April 2018.  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  She was offered screening with breast MRI verses bilateral mastectomy.  She agreed to breast MRI,  as well as a referral to our office.  We began seeing her in June 2018 and recommended annual mammogram alternating with MRI, every 6 month breast exam and also offered her chemoprevention with tamoxifen, which she declined at that time due to her age. As she does not have a first-degree relative with pancreatic cancer screening for pancreatic cancer in patients with an ATM mutation was not recommended.  Screening mammogram in November 2019 showed possible distortion in the right breast.  Right diagnostic mammogram in December revealed a persistent large area of distortion in the lateral right breast at middle depth.  Ultrasound of this area revealed a 2 cm irregular hypoechoic mass  at 9 o'clock.  MRI breast revealed architectural distortion of the right breast with margins poorly defined, but measuring approximately 2.6 cm in the lower outer quadrant.  Biopsy revealed grade 2 invasive lobular carcinoma involving all cores, with a length of to 1.2 cm.  Estrogen and progesterone receptors were positive and HER 2 negative.  Ki 67 was 10%.    She underwent bilateral mastectomy with right sentinel lymph node biopsy in January 2020.  Pathology revealed a 2.5 cm, grade 2, invasive lobular carcinoma, which was multifocal.  There were 2 additional foci of invasive lobular carcinoma measuring 3 mm in 2 of the sections of the subareolar breast tissue.  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  One lymph node was removed which contained isolated tumor cells.  We recommended for adjuvant chemotherapy consisting of Adriamycin/cyclophosphamide.  Echocardiogram in February revealed normal left ventricular size and function with an left ventricular ejection fraction of 60-65%.  Bone density scan in February was normal.  She completed 4 cycles of Adriamycin/cyclophosphamide in March.  Adjuvant radiation was not recommended.  Echocardiogram in April revealed normal left ventricular size and function with an ejection fraction of 60-65%.  However there was an echodensity close to the tricuspid valve of uncertain significance.  She therefore had a cardiac MRI in May, which was normal.  She was placed on tamoxifen 20 mg daily June 1st 2020.  She had her port removed in May.  She had previous hysterectomy for dysfunctional uterine bleeding.  We recommended bilateral oophorectomy to decrease her risk of recurrent or new breast cancer and she had that done in August 2020.  Dr. Marla Roe started her breast reconstruction process in September 2020.  She has concerns today regarding a family member (cousin) who had a double mastectomy 6  years ago, hysterectomy 3 years ago and now has recurrence of disease to her breast and liver.   INTERVAL HISTORY:  [name] is here for routine follow up and states that she has been well.  She has had her 3D nipple tattooing with Dr. Audelia Hives and is very satisfied with her results.  She continues tamoxifen daily without significant difficulty.  She states that her cousin has had recurrent breast cancer develop within the last 3 months, despite bilateral mastectomies, hysterectomy and bilateral salpingo oophorectomy.  She also had an ATM mutation so the patient is understandable anxious about the possibility of recurrence.  I feel she has done everything that we can to prevent that.  Her  appetite is good, and she has lost 1 and 1/2 pounds since her last visit.  She denies fever, chills or other signs of infection.  She denies nausea, vomiting, bowel issues, or abdominal pain.  She denies sore throat, cough, dyspnea, or chest pain.  REVIEW OF SYSTEMS:  Review of Systems  Constitutional: Negative.   HENT:  Negative.   Eyes: Negative.   Respiratory: Negative.   Cardiovascular: Negative.   Gastrointestinal: Negative.   Endocrine: Negative.   Genitourinary: Negative.    Musculoskeletal: Negative.   Skin: Negative.   Neurological: Negative.   Hematological: Negative.   Psychiatric/Behavioral: Negative.      VITALS:  Blood pressure 139/67, pulse 61, temperature 97.9 F (36.6 C), temperature source Oral, resp. rate 18, height $RemoveBe'5\' 3"'hdsmGOLyZ$  (1.6 m), weight 173 lb 3.2 oz (78.6 kg), SpO2 99 %.  Wt Readings from Last 3 Encounters:  03/11/20 173 lb 3.2 oz (78.6 kg)  02/10/20 168 lb (76.2 kg)  01/13/20 167 lb (75.8 kg)    Body mass index is 30.68 kg/m.  Performance status (ECOG): 0 - Asymptomatic  PHYSICAL EXAM:  Physical Exam Constitutional:      General: She is not in acute distress.    Appearance: Normal appearance. She is normal weight.  HENT:     Head: Normocephalic and atraumatic.   Eyes:     General: No scleral icterus.    Extraocular Movements: Extraocular movements intact.     Conjunctiva/sclera: Conjunctivae normal.     Pupils: Pupils are equal, round, and reactive to light.  Cardiovascular:     Rate and Rhythm: Normal rate and regular rhythm.     Pulses: Normal pulses.     Heart sounds: Normal heart sounds. No murmur heard. No friction rub. No gallop.   Pulmonary:     Effort: Pulmonary effort is normal. No respiratory distress.     Breath sounds: Normal breath sounds.  Chest:     Comments: Bilateral reconstructions are negative.   Abdominal:     General: Bowel sounds are normal. There is no distension.     Palpations: Abdomen is soft. There is no mass.     Tenderness: There is no abdominal tenderness.  Musculoskeletal:        General: Normal range of motion.     Cervical back: Normal range of motion and neck supple.     Right lower leg: No edema.     Left lower leg: No edema.  Lymphadenopathy:     Cervical:  No cervical adenopathy.  Skin:    General: Skin is warm and dry.  Neurological:     General: No focal deficit present.     Mental Status: She is alert and oriented to person, place, and time. Mental status is at baseline.  Psychiatric:        Mood and Affect: Mood normal.        Behavior: Behavior normal.        Thought Content: Thought content normal.        Judgment: Judgment normal.     LABS:   CBC Latest Ref Rng & Units 09/08/2019  WBC 3.4 - 10.8 x10E3/uL 5.1  Hemoglobin 11.1 - 15.9 g/dL 13.5  Hematocrit 34.0 - 46.6 % 41.8  Platelets 150 - 450 x10E3/uL 338   CMP Latest Ref Rng & Units 09/08/2019  Glucose 65 - 99 mg/dL 89  BUN 6 - 24 mg/dL 9  Creatinine 0.57 - 1.00 mg/dL 0.72  Sodium 134 - 144 mmol/L 141  Potassium 3.5 - 5.2 mmol/L 4.0  Chloride 96 - 106 mmol/L 105  CO2 20 - 29 mmol/L 23  Calcium 8.7 - 10.2 mg/dL 9.0  Total Protein 6.0 - 8.5 g/dL 6.5  Total Bilirubin 0.0 - 1.2 mg/dL 0.5  Alkaline Phos 48 - 121 IU/L 102  AST 0  - 40 IU/L 18  ALT 0 - 32 IU/L 17    STUDIES:  No results found.   Allergies:  Allergies  Allergen Reactions  . Dilaudid [Hydromorphone Hcl] Itching    Current Medications: Current Outpatient Medications  Medication Sig Dispense Refill  . tamoxifen (NOLVADEX) 20 MG tablet Take 1 tablet (20 mg total) by mouth daily. 90 tablet 3  . Vitamin D, Ergocalciferol, (DRISDOL) 1.25 MG (50000 UNIT) CAPS capsule Take 1 capsule (50,000 Units total) by mouth every 7 (seven) days. 4 capsule 0   No current facility-administered medications for this visit.     ASSESSMENT & PLAN:   Assessment:   1. Stage IIA breast cancer which was ER/PR positive and HER2 negative.  She completed adjuvant chemotherapy with Adriamycin/cyclophosphamide for 4 cycles in March of 2020.  She knows to continue tamoxifen daily.  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  Unfortunately, she developed a breast cancer.  She has undergone bilateral mastectomy with postoperative chemotherapy.  She has had a prophylactic bilateral salpingo oophorectomy so we could consider placing her on an alterative aromatase inhibitor if she cannot tolerate the tamoxifen.    Plan: She knows to continue her tamoxifen daily.  We will see her back in 4 months with CBC and CMP for repeat examination.  She verbalizes understanding of an agreement to the plans discussed today.  She knows to call the office should any new questions or concerns arise.   I provided 20 minutes of face-to-face time during this this encounter and > 50% was spent counseling as documented under my assessment and plan.    Derwood Kaplan, MD [facility] [address] [place] [address] Dept: [phone] Dept Fax: [phone]   I, Rita Ohara, am acting as scribe for Derwood Kaplan, MD  I have reviewed this report as typed by the  medical scribe, and it is complete and accurate.

## 2020-03-11 ENCOUNTER — Encounter: Payer: Self-pay | Admitting: Oncology

## 2020-03-11 ENCOUNTER — Other Ambulatory Visit: Payer: Self-pay

## 2020-03-11 ENCOUNTER — Inpatient Hospital Stay: Payer: Self-pay | Attending: Oncology | Admitting: Oncology

## 2020-03-11 ENCOUNTER — Other Ambulatory Visit: Payer: Self-pay | Admitting: Oncology

## 2020-03-11 ENCOUNTER — Telehealth: Payer: Self-pay | Admitting: Oncology

## 2020-03-11 DIAGNOSIS — C50511 Malignant neoplasm of lower-outer quadrant of right female breast: Secondary | ICD-10-CM

## 2020-03-11 DIAGNOSIS — C50919 Malignant neoplasm of unspecified site of unspecified female breast: Secondary | ICD-10-CM | POA: Insufficient documentation

## 2020-03-11 NOTE — Telephone Encounter (Signed)
Per 1/21 los next appt sched and given to patient 

## 2020-05-02 ENCOUNTER — Telehealth: Payer: Self-pay | Admitting: Oncology

## 2020-05-02 NOTE — Telephone Encounter (Signed)
05/02/20 Patient rescheduled appts

## 2020-07-01 ENCOUNTER — Encounter: Payer: Self-pay | Admitting: Hematology and Oncology

## 2020-07-01 ENCOUNTER — Other Ambulatory Visit: Payer: Self-pay

## 2020-07-01 ENCOUNTER — Inpatient Hospital Stay: Payer: BC Managed Care – PPO | Attending: Oncology

## 2020-07-01 ENCOUNTER — Inpatient Hospital Stay: Payer: BC Managed Care – PPO | Admitting: Oncology

## 2020-07-01 DIAGNOSIS — C50511 Malignant neoplasm of lower-outer quadrant of right female breast: Secondary | ICD-10-CM

## 2020-07-01 DIAGNOSIS — Z170 Estrogen receptor positive status [ER+]: Secondary | ICD-10-CM

## 2020-07-01 LAB — HEPATIC FUNCTION PANEL
ALT: 19 (ref 7–35)
AST: 23 (ref 13–35)
Alkaline Phosphatase: 81 (ref 25–125)
Bilirubin, Total: 0.5

## 2020-07-01 LAB — COMPREHENSIVE METABOLIC PANEL
Albumin: 3.7 (ref 3.5–5.0)
Calcium: 8.5 — AB (ref 8.7–10.7)

## 2020-07-01 LAB — BASIC METABOLIC PANEL
BUN: 11 (ref 4–21)
CO2: 25 — AB (ref 13–22)
Chloride: 109 — AB (ref 99–108)
Creatinine: 0.7 (ref 0.5–1.1)
Glucose: 92
Potassium: 3.8 (ref 3.4–5.3)
Sodium: 137 (ref 137–147)

## 2020-07-01 LAB — CBC: RBC: 4.12 (ref 3.87–5.11)

## 2020-07-01 LAB — CBC AND DIFFERENTIAL
HCT: 37 (ref 36–46)
Hemoglobin: 12.6 (ref 12.0–16.0)
Neutrophils Absolute: 3.47
Platelets: 280 (ref 150–399)
WBC: 5.6

## 2020-07-03 NOTE — Progress Notes (Signed)
[facility] Follow up:    Nodal, Alphonzo Dublin, PA-C [address] [place] [address]   DIAGNOSIS: Cancer Staging Malignant neoplasm of lower-outer quadrant of right breast of female, estrogen receptor positive ([facility]) Staging form: Breast, AJCC 8th Edition - Clinical stage from 02/06/2018: Stage IB (cT2, cN0(sn), cM0, G2, ER+, PR+, HER2-) - Signed by Derwood Kaplan, MD on 03/11/2020 Histopathologic type: Lobular carcinoma, NOS Stage prefix: Initial diagnosis Method of lymph node assessment: Sentinel lymph node biopsy Nuclear grade: G2 Multigene prognostic tests performed: None Menopausal status: Premenopausal Ki-67 (%): 10 Prognostic indicators: Node with ITC Stage used in treatment planning: Yes National guidelines used in treatment planning: Yes Type of national guideline used in treatment planning: NCCN Staging comments: AC x 4, hormonal therapy, bilat mast. ATM mutation   SUMMARY OF ONCOLOGIC HISTORY: Oncology History  Malignant neoplasm of lower-outer quadrant of right breast of female, estrogen receptor positive ([facility])  05/2016 Genetic Testing   ATM Mutation Positive: opted for intensified screening with annual diagnostic mammogram alternating with annual MRI   02/06/2018 Cancer Staging   Staging form: Breast, AJCC 8th Edition - Clinical stage from 02/06/2018: Stage IB (cT2, cN0(sn), cM0, G2, ER+, PR+, HER2-) - Signed by Derwood Kaplan, MD on 03/11/2020 Prognostic indicators: Node with ITC Staging comments: AC x 4, hormonal therapy, bilat mast. ATM mutation    Surgery   Bilateral Mastectomy and (R) SLNB: ILC 2.5cm, 1 SLN with ITC.   02/2018 - 04/2018 Adjuvant Chemotherapy   Four cycles of adriamycin/cytoxan   07/21/2018 -  Anti-estrogen oral therapy   Tamoxifen daily   09/2018 Surgery   BSO     CURRENT THERAPY: Tamoxifen daily  INTERVAL HISTORY: Tina Monroe 50 y.o. female returns for evaluation of her h/o  breast cancer.  She is taking Tamoxifen.  She struggles with her weight on this due to increasing sugar cravings.  She notes initially she had difficulty with this, however she has done well since restarting after a short holiday.  Healthy feet and nerves vitamin seems to help her.    She has a PCP, but does not go regularly.  She says she does not have a cholesterol check.  But did have this checked previously at [facility].  She underwent colonoscopy in 2021 and was cleared for 10 years.  She sees [facility] and recognizes she needs to schedule a f/u since she does this annually.  She is exercising, and notes she just restarted going to bungee aerobics.  This is similar to bungee jumping, but the bungee cord helps keep weight off of her joints.     Patient Active Problem List   Diagnosis Date Noted  . 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 At risk for osteoporosis 01/13/2020  . Insulin resistance 12/14/2019  . B12 deficiency 12/14/2019  . Depression 12/14/2019  . Other hyperlipidemia 10/14/2019  . Vitamin D deficiency 10/14/2019  . S/P breast reconstruction, bilateral 06/10/2019  . Acquired absence of breast and absent nipple, bilateral 05/27/2018  . Malignant neoplasm of lower-outer quadrant of right breast of female, estrogen receptor positive ([facility]) 02/18/2018  . Sprain of right ankle 04/23/2017    is allergic to dilaudid [hydromorphone hcl].  MEDICAL HISTORY: Past Medical History:  Diagnosis Date  . [facility]    breast cancer  . Constipation   . GERD (gastroesophageal reflux disease)   .  Joint pain   . SOB (shortness of breath)   . Swelling of both lower extremities     SURGICAL HISTORY: Past Surgical History:  Procedure Laterality Date  . ABDOMINAL HYSTERECTOMY  09/25/2018   partial this year, other partial in 2003  . BREAST RECONSTRUCTION WITH PLACEMENT OF TISSUE EXPANDER AND  FLEX HD (ACELLULAR HYDRATED DERMIS) Bilateral 10/23/2018   Procedure: BREAST RECONSTRUCTION WITH PLACEMENT OF TISSUE EXPANDER AND FLEX HD (ACELLULAR HYDRATED DERMIS);  Surgeon: Wallace Going, DO;  Location: [facility];  Service: Plastics;  Laterality: Bilateral;  2.5 hours  . CARDIAC CATHETERIZATION     post chemo, everything normal  . HYSTERECTOMY ABDOMINAL WITH SALPINGECTOMY  2021  . MASTECTOMY  02/2018  . MENISCUS REPAIR  2000  . PORTA CATH INSERTION  02/2018  . PORTA CATH REMOVAL    . REMOVAL OF BILATERAL TISSUE EXPANDERS WITH PLACEMENT OF BILATERAL BREAST IMPLANTS Bilateral 01/22/2019   Procedure: REMOVAL OF BILATERAL TISSUE EXPANDERS WITH PLACEMENT OF BILATERAL BREAST IMPLANTS;  Surgeon: Wallace Going, DO;  Location: [facility];  Service: Plastics;  Laterality: Bilateral;  . torn miniscus right knee  2012    SOCIAL HISTORY: Social History   Socioeconomic History  . Marital status: Married    Spouse name: Jossalin Chervenak  . Number of children: 3  . Years of education: Not on file  . Highest education level: Not on file  Occupational History  . Occupation: Environmental consultant principal  Tobacco Use  . Smoking status: Never Smoker  . Smokeless tobacco: Never Used  Vaping Use  . Vaping Use: Never used  Substance and Sexual Activity  . Alcohol use: Not Currently  . Drug use: No  . Sexual activity: Not on file  Other Topics Concern  . Not on file  Social History Narrative  . Not on file   Social Determinants of Health   Financial Resource Strain: Not on file  Food Insecurity: Not on file  Transportation Needs: Not on file  Physical Activity: Not on file  Stress: Not on file  Social Connections: Not on file  Intimate Partner Violence: Not on file    FAMILY HISTORY: Family History  Problem Relation Age of Onset  . Thyroid disease Mother   . Obesity Mother   . Cancer Father     Review of Systems  Constitutional: Negative for  appetite change, chills, fatigue, fever and unexpected weight change.  HENT:   Negative for hearing loss, lump/mass and trouble swallowing.   Eyes: Negative for eye problems and icterus.  Respiratory: Negative for chest tightness, cough and shortness of breath.   Cardiovascular: Negative for chest pain, leg swelling and palpitations.  Gastrointestinal: Negative for abdominal distention, abdominal pain, constipation, diarrhea, nausea and vomiting.  Endocrine: Negative for hot flashes.  Genitourinary: Negative for difficulty urinating.   Musculoskeletal: Negative for arthralgias.  Skin: Negative for itching and rash.  Neurological: Negative for dizziness, extremity weakness, headaches and numbness.  Hematological: Negative for adenopathy. Does not bruise/bleed easily.  Psychiatric/Behavioral: Negative for depression. The patient is not nervous/anxious.       PHYSICAL EXAMINATION  ECOG PERFORMANCE STATUS: 1 - Symptomatic but completely ambulatory  Vitals:   07/04/20 0900  BP: (!) 114/58  Pulse: 66  Resp: 18  Temp: 98.1 F (36.7 C)  SpO2: 100%    Physical Exam Constitutional:      General: She is not in acute distress.    Appearance: Normal appearance. She is not toxic-appearing.  HENT:  Head: Normocephalic and atraumatic.  Eyes:     General: No scleral icterus. Cardiovascular:     Rate and Rhythm: Normal rate and regular rhythm.     Pulses: Normal pulses.     Heart sounds: Normal heart sounds.  Pulmonary:     Effort: Pulmonary effort is normal.     Breath sounds: Normal breath sounds.     Comments: Bilateral breast s/p mastectomy and reconstruction, no sign of local recurrence. Abdominal:     General: Abdomen is flat. Bowel sounds are normal. There is no distension.     Palpations: Abdomen is soft.     Tenderness: There is no abdominal tenderness.  Musculoskeletal:        General: No swelling.     Cervical back: Neck supple.  Lymphadenopathy:     Cervical: No  cervical adenopathy.  Skin:    General: Skin is warm and dry.     Findings: No rash.  Neurological:     General: No focal deficit present.     Mental Status: She is alert.  Psychiatric:        Mood and Affect: Mood normal.        Behavior: Behavior normal.     LABORATORY DATA:  CBC    Component Value Date/Time   WBC 5.6 07/01/2020 0000   RBC 4.12 07/01/2020 0000   HGB 12.6 07/01/2020 0000   HGB 13.5 09/08/2019 1624   HCT 37 07/01/2020 0000   HCT 41.8 09/08/2019 1624   PLT 280 07/01/2020 0000   PLT 338 09/08/2019 1624   MCV 91 09/08/2019 1624   MCH 29.5 09/08/2019 1624   MCHC 32.3 09/08/2019 1624   RDW 12.7 09/08/2019 1624   LYMPHSABS 1.4 09/08/2019 1624   EOSABS 0.1 09/08/2019 1624   BASOSABS 0.1 09/08/2019 1624    CMP     Component Value Date/Time   NA 137 07/01/2020 0000   K 3.8 07/01/2020 0000   CL 109 (A) 07/01/2020 0000   CO2 25 (A) 07/01/2020 0000   GLUCOSE 89 09/08/2019 1624   BUN 11 07/01/2020 0000   CREATININE 0.7 07/01/2020 0000   CREATININE 0.72 09/08/2019 1624   CALCIUM 8.5 (A) 07/01/2020 0000   PROT 6.5 09/08/2019 1624   ALBUMIN 3.7 07/01/2020 0000   ALBUMIN 4.2 09/08/2019 1624   AST 23 07/01/2020 0000   ALT 19 07/01/2020 0000   ALKPHOS 81 07/01/2020 0000   BILITOT 0.5 09/08/2019 1624   GFRNONAA 97 09/08/2019 1624   GFRAA 112 09/08/2019 1624    ASSESSMENT and PLAN:   Malignant neoplasm of lower-outer quadrant of right breast of female, estrogen receptor positive ([facility]) Shanon Seawright is a 50 year old woman with stage IIA right sided invasive lobular carcinoma diagnosed in 01/2018 s/p bilateral mastectomies, adjuvant chemotherapy, and has been on Tamoxifen since 07/2018.    1. Stage IIA breast cancer: she has no clinical signs of breast cancer recurrence.  We reviewed signs of recurrence, and she knows to call if she feels like she needs to be seen.  We reviewed her lab work today which were all within acceptable parameters.   2. Weight  issues on Tamoxifen.  She has struggled with this since its start and craving sugars.  She is focused on a well balanced diet and exercise, which is great.    3. Bone Health.  Her most recent bone density test was completed in 03/2018 and was normal with a T score of -0.2 in the AP spine.  We reviewed how tamoxifen works, and that it actually helps strengthen the bones.  We also reviewed that her bungee exercise is great for her bones in addition to getting calcium intake from food and drink.  Considering her most recent bone density test, and the additional factors listed above, I let her know that she could wait until 03/2023 for repeat bone density testing.  4. Health maintenance.  Shunta is up to date with her cancer screenings.  She has not seen her PCP for a well visit, and I did recommend she go and get a lab panel to review her cholesterol, and other panels for healthy living.  She understands this.  She is eating well and exercising.    5. ATM Mutation: She has undergone bilateral mastectomies and RRBSO.  She has no changes in her family history that would indicate the need to do MRCP for pancreatic cancer screening.    We will see Deaysia back in 6 months for labs and f/u with Dr. Hinton Rao.  She knows to call for any questions that may arise between now and her next appointment.  We are happy to see her sooner if needed.    Total encounter time: 35 minutes* reviewing the history with the patient, face to face visit time, chart review, and documentation.   Wilber Bihari, NP 07/04/20 9:51 AM Medical Oncology and Hematology [facility] [place], [place] [address] Tel. [phone]    Fax. [phone]  *Total Encounter Time as defined by the Centers for Medicare and Medicaid Services includes, in addition to the face-to-face time of a patient visit (documented in the note above) non-face-to-face time: obtaining and reviewing outside history, ordering and  reviewing medications, tests or procedures, care coordination (communications with other health care professionals or caregivers) and documentation in the medical record.

## 2020-07-04 ENCOUNTER — Inpatient Hospital Stay (INDEPENDENT_AMBULATORY_CARE_PROVIDER_SITE_OTHER): Payer: BC Managed Care – PPO | Admitting: Adult Health

## 2020-07-04 ENCOUNTER — Encounter: Payer: Self-pay | Admitting: Adult Health

## 2020-07-04 ENCOUNTER — Other Ambulatory Visit: Payer: Self-pay

## 2020-07-04 VITALS — BP 114/58 | HR 66 | Temp 98.1°F | Resp 18 | Wt 170.5 lb

## 2020-07-04 DIAGNOSIS — C50919 Malignant neoplasm of unspecified site of unspecified female breast: Secondary | ICD-10-CM | POA: Diagnosis not present

## 2020-07-04 DIAGNOSIS — C50511 Malignant neoplasm of lower-outer quadrant of right female breast: Secondary | ICD-10-CM

## 2020-07-04 DIAGNOSIS — Z170 Estrogen receptor positive status [ER+]: Secondary | ICD-10-CM | POA: Diagnosis not present

## 2020-07-04 NOTE — Assessment & Plan Note (Signed)
Tina Monroe is a 50 year old woman with stage IIA right sided invasive lobular carcinoma diagnosed in 01/2018 s/p bilateral mastectomies, adjuvant chemotherapy, and has been on Tamoxifen since 07/2018.    1. Stage IIA breast cancer: she has no clinical signs of breast cancer recurrence.  We reviewed signs of recurrence, and she knows to call if she feels like she needs to be seen.  We reviewed her lab work today which were all within acceptable parameters.   2. Weight issues on Tamoxifen.  She has struggled with this since its start and craving sugars.  She is focused on a well balanced diet and exercise, which is great.    3. Bone Health.  Her most recent bone density test was completed in 03/2018 and was normal with a T score of -0.2 in the AP spine.  We reviewed how tamoxifen works, and that it actually helps strengthen the bones.  We also reviewed that her bungee exercise is great for her bones in addition to getting calcium intake from food and drink.  Considering her most recent bone density test, and the additional factors listed above, I let her know that she could wait until 03/2023 for repeat bone density testing.  4. Health maintenance.  [name] is up to date with her cancer screenings.  She has not seen her PCP for a well visit, and I did recommend she go and get a lab panel to review her cholesterol, and other panels for healthy living.  She understands this.  She is eating well and exercising.    5. ATM Mutation: She has undergone bilateral mastectomies and RRBSO.  She has no changes in her family history that would indicate the need to do MRCP for pancreatic cancer screening.    We will see [name] back in 6 months for labs and f/u with Dr. Hinton Rao.  She knows to call for any questions that may arise between now and her next appointment.  We are happy to see her sooner if needed.

## 2020-07-07 IMAGING — MR MR CARDIA MORPHOLOGY WITHOUT AND WITH CONTRAST
20 of 23 series · 37 of 40 positions shown · IV contrast (gadavist)
Comparison: none

CLINICAL DATA: 48-year-old female with h/o breast cancer, s/p
bilateral mastectomy and abnormal echocardiogram.

EXAM:
CARDIAC MRI
TECHNIQUE: The patient was scanned on a 1.5 Tesla GE magnet. A dedicated
cardiac coil was used. Functional imaging was done using [employer]
sequences. [DATE], and 4 chamber views were done to assess for RWMA's.
Modified Stephenson rule using a short axis stack was used to
calculate an ejection fraction on a dedicated work station using
[employer] software. The patient received 8 cc of Gadavist. After 10
minutes inversion recovery sequences were used to assess for
infiltration and scar tissue.
CONTRAST:  8 cc  of Gadavist

[Series 9: bSSFP · oblique · 8.0mm · 1.56mm/px · 10 of 450 slices shown (1 of 5)]
[im 1/450]
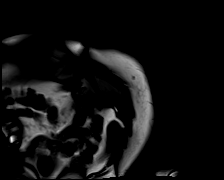
[im 50/450]
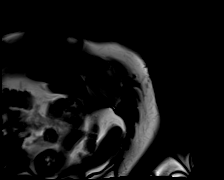
[im 100/450]
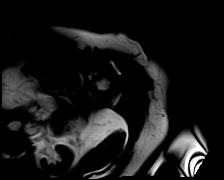
[im 150/450]
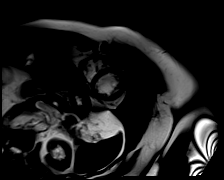
[im 200/450]
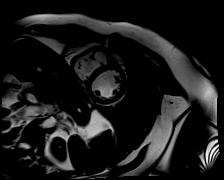
[im 250/450]
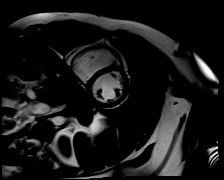
[im 300/450]
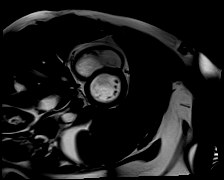
[im 350/450]
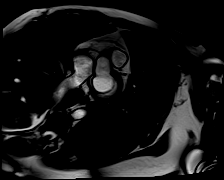
[im 400/450]
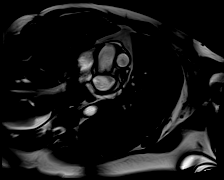
[im 450/450]
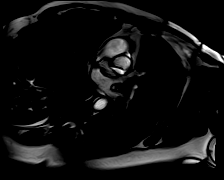

[Series 14: bSSFP · oblique · 6.0mm · 1.41mm/px · 1 of 25 slices shown (2 of 5)]
[im 1/25]
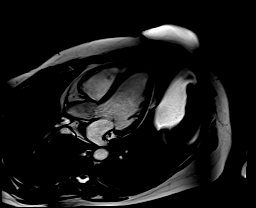

[Series 15: bSSFP · axial · 6.0mm · 1.41mm/px · 1 of 25 slices shown (3 of 5)]
[im 1/25]
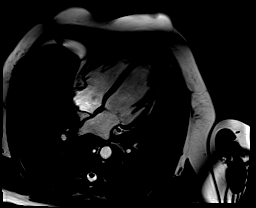

[Series 16: bSSFP · sagittal · 6.0mm · 1.41mm/px · 1 of 25 slices shown (4 of 5)]
[im 1/25]
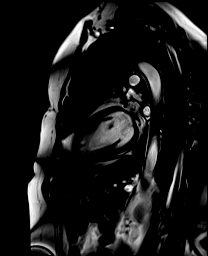

[Series 17: bSSFP · oblique · 8.0mm · 1.56mm/px · 4 of 250 slices shown (5 of 5)]
[im 1/250]
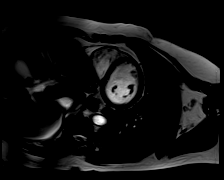
[im 84/250]
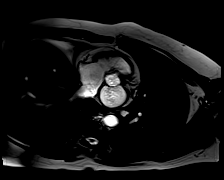
[im 167/250]
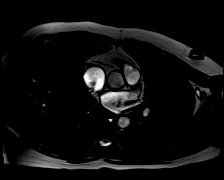
[im 250/250]
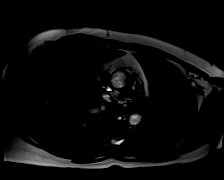

[Series 18: 2ch stack · sagittal · 6.0mm · 1.41mm/px · 1 of 25 slices shown (1 of 6)]
[im 1/25]
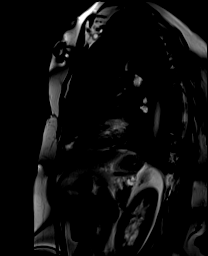

[Series 19: 2ch stack · sagittal · 6.0mm · 1.41mm/px · 1 of 25 slices shown (2 of 6)]
[im 1/25]
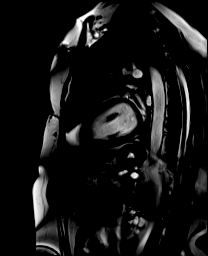

[Series 20: 2ch stack · sagittal · 6.0mm · 1.41mm/px · 1 of 25 slices shown (3 of 6)]
[im 1/25]
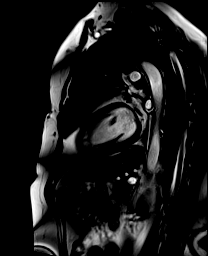

[Series 21: 2ch stack · sagittal · 6.0mm · 1.41mm/px · 1 of 25 slices shown (4 of 6)]
[im 1/25]
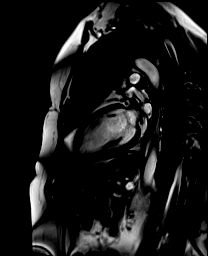

[Series 22: 2ch stack · sagittal · 6.0mm · 1.41mm/px · 1 of 25 slices shown (5 of 6)]
[im 1/25]
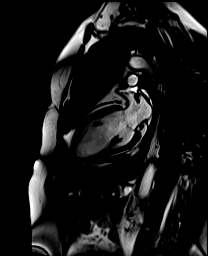

[Series 23: 2ch stack · sagittal · 6.0mm · 1.41mm/px · 1 of 25 slices shown (6 of 6)]
[im 1/25]
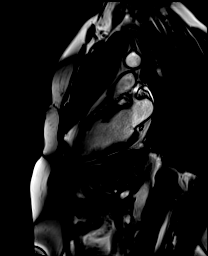

[Series 24: long axis stack · axial · 6.0mm · 1.56mm/px · z∈[-163,-108]mm · 6 of 300 slices shown]
[im 1/300]
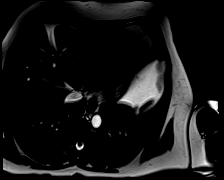
[im 60/300]
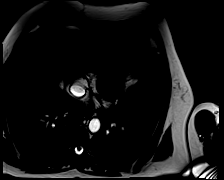
[im 120/300]
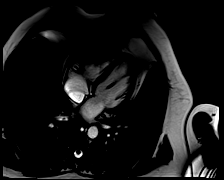
[im 180/300]
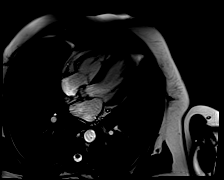
[im 240/300]
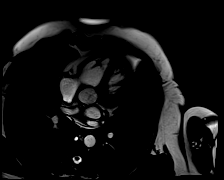
[im 300/300]
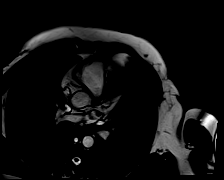

[Series 26: lge_single shot sa · oblique · 8.0mm · 1.98mm/px · 1 of 18 slices shown (1 of 2)]
[im 1/18]
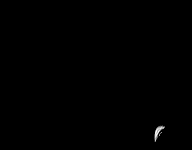

[Series 27: lge_single shot sa · oblique · 8.0mm · 1.98mm/px · 1 of 18 slices shown (2 of 2)]
[im 1/18]
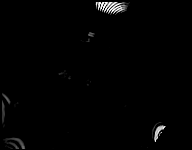

[Series 41: lge_single shot radial_mag · axial · 6.0mm · 1.98mm/px · 1 of 1 slices shown]
[im 1/1]
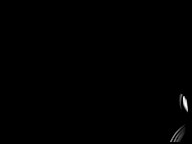

[Series 42: lge_single shot radial_psir · axial · 6.0mm · 1.98mm/px · 1 of 1 slices shown]
[im 1/1]
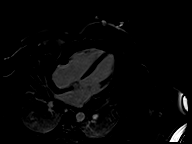

[Series 45: lge short axis_mag · oblique · 8.0mm · 1.61mm/px · 1 of 17 slices shown]
[im 1/17]
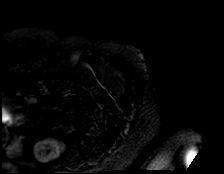

[Series 46: lge short axis_psir · oblique · 8.0mm · 1.61mm/px · 1 of 17 slices shown]
[im 1/17]
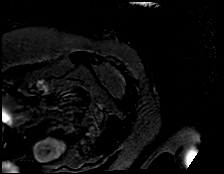

[Series 49: lge radial ((date)ch)_mag · axial · 6.0mm · 1.61mm/px · 1 of 1 slices shown]
[im 1/1]
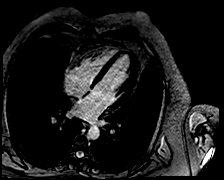

[Series 50: lge radial ((date)ch)_psir · axial · 6.0mm · 1.61mm/px · 1 of 1 slices shown]
[im 1/1]
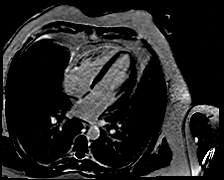

[37 of 40 positions shown; findings below may reference images not displayed]

FINDINGS: 1. Normal left ventricular size, thickness and systolic function
(LVEF = 61%). There are no regional wall motion abnormalities.

There is no late gadolinium enhancement in the left ventricular
myocardium.

LVEDD: 53 mm

LVESD: 33 mm

LVEDV: 123 ml

LVESV: 48 ml

SV: 75 ml

CO: 4.1 L/min

Myocardial mass: 90 g

2. Normal right ventricular size, thickness and systolic function
(LVEF = 59%). There are no regional wall motion abnormalities.

3.  Normal left and right atrial size.

4. Normal size of the aortic root, ascending aorta and pulmonary
artery.

5.  Trivial mitral and mild tricuspid regurgitation.

6.  Normal pericardium.  No pericardial effusion.
IMPRESSION: 1. Normal left ventricular size, thickness and systolic function
(LVEF = 61%). There are no regional wall motion abnormalities. There
is no late gadolinium enhancement in the left ventricular
myocardium.

2. Normal right ventricular size, thickness and systolic function
(LVEF = 59%). There are no regional wall motion abnormalities.

3. Normal left and right atrial size.

4. Normal size of the aortic root, ascending aorta and pulmonary
artery.

5. Trivial mitral and mild tricuspid regurgitation.

6. Normal pericardium.  No pericardial effusion.

Normal cardiac MRI.

## 2020-07-08 ENCOUNTER — Ambulatory Visit: Payer: Self-pay | Admitting: Oncology

## 2020-07-08 ENCOUNTER — Other Ambulatory Visit: Payer: Self-pay

## 2020-12-27 ENCOUNTER — Ambulatory Visit: Payer: BC Managed Care – PPO | Admitting: Plastic Surgery

## 2020-12-29 NOTE — Progress Notes (Signed)
[facility]  [address] [place],  [place]  [address] [phone]  Clinic Day:  01/05/2021  Referring physician: Maggie Schwalbe, PA-C   This document serves as a record of services personally performed by [name], MD. It was created on their behalf by Curry,Lauren E, a trained medical scribe. The creation of this record is based on the scribe's personal observations and the provider's statements to them.  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   HISTORY OF PRESENT ILLNESS:  Tina Monroe is a 50 y.o. 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  She had a previous right breast biopsy in August 2017, which revealed a fibroadenoma.  She has a family history of breast cancer and her sister was found to have a mutation in the ATM gene, so the patient underwent genetic testing in April 2018.  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  She was offered screening with breast MRI verses bilateral mastectomy.  She agreed to breast MRI,  as well as a referral to our office.  We began seeing her in June 2018 and recommended annual mammogram alternating with MRI, every 6 month breast exam and also offered her chemoprevention with tamoxifen, which she declined at that time due to her age. As she does not have a first-degree relative with pancreatic cancer screening for pancreatic cancer in patients with an ATM mutation was not recommended.  Screening mammogram in November 2019 showed possible distortion in the right breast.  Right diagnostic mammogram in December revealed a persistent large area of distortion in the lateral right breast at middle depth.  Ultrasound of this area revealed a 2 cm irregular hypoechoic mass  at 9 o'clock.  MRI breast revealed architectural distortion of the right breast with margins poorly defined, but measuring approximately 2.6 cm in the lower outer quadrant.  Biopsy revealed grade 2 invasive lobular carcinoma involving all cores, with a length of to 1.2 cm.  Estrogen and progesterone receptors were positive and HER 2 negative.  Ki 67 was 10%.    She underwent bilateral mastectomy with right sentinel lymph node biopsy in January 2020.  Pathology revealed a 2.5 cm, grade 2, invasive lobular carcinoma, which was multifocal.  There were 2 additional foci of invasive lobular carcinoma measuring 3 mm in 2 of the sections of the subareolar breast tissue.  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  One lymph node was removed which contained isolated tumor cells.  We recommended for adjuvant chemotherapy consisting of Adriamycin/cyclophosphamide.  Echocardiogram in February revealed normal left ventricular size and function with an left ventricular ejection fraction of 60-65%.  Bone density scan in February was normal.  She completed 4 cycles of Adriamycin/cyclophosphamide in March.  Adjuvant radiation was not recommended.  Echocardiogram in April revealed normal left ventricular size and function with an ejection fraction of 60-65%.  However there was an echodensity close to the tricuspid valve of uncertain significance.  She therefore had a cardiac MRI in May, which was normal.  She was placed on tamoxifen 20 mg daily June 1st 2020.  She had her port removed in May.  She had previous hysterectomy for dysfunctional uterine bleeding.  We  recommended bilateral oophorectomy to decrease her risk of recurrent or new breast cancer and she had that done in August 2020.  Dr. Marla Roe started her breast reconstruction process in September 2020.  She has concerns today regarding a family member (cousin) who had a double mastectomy 6  years ago, hysterectomy 3 years ago and now has recurrence of disease to her breast and liver.   INTERVAL HISTORY:  Tina Monroe is here for routine follow up and reports worsening right knee pain. This began after a fall, and she has been given two cortisone injections without improvement. She feels that her knee will give out at times. She continues tamoxifen daily without significant difficulty. Blood counts and chemistries are unremarkable. Her  appetite is good, and she has gained 11 pounds since her last visit.  She denies fever, chills or other signs of infection.  She denies nausea, vomiting, bowel issues, or abdominal pain.  She denies sore throat, cough, dyspnea, or chest pain.  REVIEW OF SYSTEMS:  Review of Systems  Constitutional: Negative.  Negative for appetite change, chills, fatigue, fever and unexpected weight change.  HENT:  Negative.    Eyes: Negative.   Respiratory: Negative.  Negative for chest tightness, cough, hemoptysis, shortness of breath and wheezing.   Cardiovascular: Negative.  Negative for chest pain, leg swelling and palpitations.  Gastrointestinal: Negative.  Negative for abdominal distention, abdominal pain, blood in stool, constipation, diarrhea, nausea and vomiting.  Endocrine: Negative.   Genitourinary: Negative.  Negative for difficulty urinating, dysuria, frequency and hematuria.   Musculoskeletal:  Positive for arthralgias (worsening, of the right knee). Negative for back pain, flank pain, gait problem and myalgias.  Skin: Negative.   Neurological:  Positive for extremity weakness (right lower extremity). Negative for dizziness, gait problem, headaches, light-headedness, numbness, seizures and speech difficulty.  Hematological: Negative.   Psychiatric/Behavioral: Negative.  Negative for depression and sleep disturbance. The patient is not nervous/anxious.     VITALS:  Blood pressure 135/65, pulse 65, temperature 98.1 F (36.7 C), temperature source Oral, resp.  rate 18, height $RemoveBe'5\' 3"'YgDsFmQwj$  (1.6 m), weight 182 lb 4.8 oz (82.7 kg), SpO2 100 %.  Wt Readings from Last 3 Encounters:  01/05/21 182 lb 4.8 oz (82.7 kg)  07/04/20 170 lb 8 oz (77.3 kg)  03/11/20 173 lb 3.2 oz (78.6 kg)    Body mass index is 32.29 kg/m.  Performance status (ECOG): 1 - Symptomatic but completely ambulatory  PHYSICAL EXAM:  Physical Exam Constitutional:      General: She is not in acute distress.    Appearance: Normal appearance. She is normal weight.  HENT:     Head: Normocephalic and atraumatic.  Eyes:     General: No scleral icterus.    Extraocular Movements: Extraocular movements intact.     Conjunctiva/sclera: Conjunctivae normal.     Pupils: Pupils are equal, round, and reactive to light.  Cardiovascular:     Rate and Rhythm: Normal rate and regular rhythm.     Pulses: Normal pulses.     Heart sounds: Normal heart sounds. No murmur heard.   No friction rub. No gallop.  Pulmonary:     Effort: Pulmonary effort is normal. No respiratory distress.     Breath sounds: Normal breath sounds.  Chest:     Comments: Bilateral reconstructions are negative. Abdominal:     General: Bowel sounds are normal. There is no distension.     Palpations: Abdomen is soft. There is no hepatomegaly, splenomegaly or mass.  Tenderness: There is no abdominal tenderness.  Musculoskeletal:        General: Normal range of motion.     Cervical back: Normal range of motion and neck supple.     Right lower leg: No edema.     Left lower leg: No edema.  Lymphadenopathy:     Cervical: No cervical adenopathy.  Skin:    General: Skin is warm and dry.  Neurological:     General: No focal deficit present.     Mental Status: She is alert and oriented to person, place, and time. Mental status is at baseline.  Psychiatric:        Mood and Affect: Mood normal.        Behavior: Behavior normal.        Thought Content: Thought content normal.        Judgment: Judgment normal.    LABS:   CBC  Latest Ref Rng & Units 01/05/2021 07/01/2020 09/08/2019  WBC - 6.4 5.6 5.1  Hemoglobin 12.0 - 16.0 13.2 12.6 13.5  Hematocrit 36 - 46 39 37 41.8  Platelets 150 - 399 297 280 338   CMP Latest Ref Rng & Units 01/05/2021 07/01/2020 09/08/2019  Glucose 65 - 99 mg/dL - - 89  BUN 4 - $R'21 15 11 9  'UI$ Creatinine 0.5 - 1.1 0.7 0.7 0.72  Sodium 137 - 147 140 137 141  Potassium 3.4 - 5.3 4.0 3.8 4.0  Chloride 99 - 108 109(A) 109(A) 105  CO2 13 - 22 25(A) 25(A) 23  Calcium 8.7 - 10.7 8.8 8.5(A) 9.0  Total Protein 6.0 - 8.5 g/dL - - 6.5  Total Bilirubin 0.0 - 1.2 mg/dL - - 0.5  Alkaline Phos 25 - 125 81 81 102  AST 13 - 35 $Re'23 23 18  'zxc$ ALT 7 - 35 $Re'24 19 17    'MXd$ STUDIES:  No results found.   Allergies:  Allergies  Allergen Reactions   Dilaudid [Hydromorphone Hcl] Itching   Hydromorphone Itching    Current Medications: Current Outpatient Medications  Medication Sig Dispense Refill   tamoxifen (NOLVADEX) 20 MG tablet Take 1 tablet (20 mg total) by mouth daily. 90 tablet 3   No current facility-administered medications for this visit.     ASSESSMENT & PLAN:   Assessment:   1. Stage IIA breast cancer which was ER/PR positive and HER2 negative, diagnosed in December 2019.  She completed adjuvant chemotherapy with Adriamycin/cyclophosphamide for 4 cycles in March of 2020.  She was placed on tamoxifen daily in June 2020, and will continue for a total of 5 years.  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  Unfortunately, she developed a breast cancer.  She has undergone bilateral mastectomy with postoperative chemotherapy.  She has had a prophylactic bilateral salpingo oophorectomy so we could consider placing her on an alterative aromatase inhibitor if she cannot tolerate the tamoxifen.    Plan: She knows to continue her tamoxifen daily.  We will see her back in 6 months with CBC and CMP for repeat examination.  She verbalizes understanding of an  agreement to the plans discussed today.  She knows to call the office should any new questions or concerns arise.   I provided 15 minutes of face-to-face time during this this encounter and > 50% was spent counseling as documented under my assessment and plan.    Derwood Kaplan, MD [facility]  AT [place] [address] Dept: [phone] Dept Fax: [phone]   I, Rita Ohara, am acting as scribe for Derwood Kaplan, MD  I have reviewed this report as typed by the medical scribe, and it is complete and accurate.

## 2020-12-30 ENCOUNTER — Ambulatory Visit: Payer: BC Managed Care – PPO | Admitting: Oncology

## 2020-12-30 ENCOUNTER — Inpatient Hospital Stay: Payer: BC Managed Care – PPO

## 2021-01-04 ENCOUNTER — Other Ambulatory Visit: Payer: Self-pay | Admitting: Oncology

## 2021-01-04 DIAGNOSIS — C50919 Malignant neoplasm of unspecified site of unspecified female breast: Secondary | ICD-10-CM

## 2021-01-05 ENCOUNTER — Inpatient Hospital Stay: Payer: BC Managed Care – PPO | Admitting: Oncology

## 2021-01-05 ENCOUNTER — Other Ambulatory Visit: Payer: Self-pay | Admitting: Hematology and Oncology

## 2021-01-05 ENCOUNTER — Other Ambulatory Visit: Payer: Self-pay | Admitting: Oncology

## 2021-01-05 ENCOUNTER — Inpatient Hospital Stay: Payer: BC Managed Care – PPO | Attending: Oncology

## 2021-01-05 ENCOUNTER — Encounter: Payer: Self-pay | Admitting: Oncology

## 2021-01-05 VITALS — BP 135/65 | HR 65 | Temp 98.1°F | Resp 18 | Ht 63.0 in | Wt 182.3 lb

## 2021-01-05 DIAGNOSIS — C50919 Malignant neoplasm of unspecified site of unspecified female breast: Secondary | ICD-10-CM

## 2021-01-05 DIAGNOSIS — Z170 Estrogen receptor positive status [ER+]: Secondary | ICD-10-CM

## 2021-01-05 DIAGNOSIS — C50511 Malignant neoplasm of lower-outer quadrant of right female breast: Secondary | ICD-10-CM | POA: Diagnosis not present

## 2021-01-05 LAB — BASIC METABOLIC PANEL
BUN: 15 (ref 4–21)
CO2: 25 — AB (ref 13–22)
Chloride: 109 — AB (ref 99–108)
Creatinine: 0.7 (ref 0.5–1.1)
Glucose: 102
Potassium: 4 (ref 3.4–5.3)
Sodium: 140 (ref 137–147)

## 2021-01-05 LAB — CBC
MCV: 89 (ref 81–99)
RBC: 4.37 (ref 3.87–5.11)

## 2021-01-05 LAB — CBC AND DIFFERENTIAL
HCT: 39 (ref 36–46)
Hemoglobin: 13.2 (ref 12.0–16.0)
Neutrophils Absolute: 3.78
Platelets: 297 (ref 150–399)
WBC: 6.4

## 2021-01-05 LAB — COMPREHENSIVE METABOLIC PANEL
Albumin: 3.9 (ref 3.5–5.0)
Calcium: 8.8 (ref 8.7–10.7)

## 2021-01-05 LAB — HEPATIC FUNCTION PANEL
ALT: 24 (ref 7–35)
AST: 23 (ref 13–35)
Alkaline Phosphatase: 81 (ref 25–125)
Bilirubin, Total: 0.4

## 2021-01-10 ENCOUNTER — Other Ambulatory Visit: Payer: Self-pay | Admitting: Hematology and Oncology

## 2021-01-10 DIAGNOSIS — Z170 Estrogen receptor positive status [ER+]: Secondary | ICD-10-CM

## 2021-01-10 DIAGNOSIS — C50511 Malignant neoplasm of lower-outer quadrant of right female breast: Secondary | ICD-10-CM

## 2021-01-23 ENCOUNTER — Telehealth: Payer: Self-pay | Admitting: Oncology

## 2021-01-23 NOTE — Telephone Encounter (Signed)
Per 11/17 LOS, patient scheduled for May 2023 Appt's.  Gave patient Appt Summary

## 2021-02-14 ENCOUNTER — Ambulatory Visit: Payer: BC Managed Care – PPO | Admitting: Plastic Surgery

## 2021-07-05 ENCOUNTER — Inpatient Hospital Stay: Payer: BC Managed Care – PPO | Attending: Oncology

## 2021-07-05 ENCOUNTER — Encounter: Payer: Self-pay | Admitting: Oncology

## 2021-07-05 ENCOUNTER — Inpatient Hospital Stay: Payer: BC Managed Care – PPO | Admitting: Oncology

## 2021-07-05 VITALS — BP 144/66 | HR 64 | Temp 97.8°F | Resp 18 | Ht 63.0 in | Wt 189.2 lb

## 2021-07-05 DIAGNOSIS — C50919 Malignant neoplasm of unspecified site of unspecified female breast: Secondary | ICD-10-CM

## 2021-07-05 LAB — BASIC METABOLIC PANEL
BUN: 15 (ref 4–21)
CO2: 23 — AB (ref 13–22)
Chloride: 106 (ref 99–108)
Creatinine: 0.7 (ref 0.5–1.1)
Glucose: 101
Potassium: 4.2 mEq/L (ref 3.5–5.1)
Sodium: 140 (ref 137–147)

## 2021-07-05 LAB — CBC AND DIFFERENTIAL
HCT: 39 (ref 36–46)
Hemoglobin: 12.6 (ref 12.0–16.0)
Neutrophils Absolute: 3.86
Platelets: 294 10*3/uL (ref 150–400)
WBC: 6.9

## 2021-07-05 LAB — HEPATIC FUNCTION PANEL
ALT: 19 U/L (ref 7–35)
AST: 20 (ref 13–35)
Alkaline Phosphatase: 92 (ref 25–125)
Bilirubin, Total: 0.5

## 2021-07-05 LAB — COMPREHENSIVE METABOLIC PANEL
Albumin: 4.1 (ref 3.5–5.0)
Calcium: 8.5 — AB (ref 8.7–10.7)

## 2021-07-05 LAB — CBC: RBC: 4.4 (ref 3.87–5.11)

## 2021-07-05 NOTE — Progress Notes (Signed)
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   HISTORY OF PRESENT ILLNESS:  Tina Monroe is a 51 y.o. 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  She had a previous right breast biopsy in August 2017, which revealed a fibroadenoma.  She has a family history of breast cancer and her sister was found to have a mutation in the ATM gene, so the patient underwent genetic testing in April 2018.  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  She was offered screening with breast MRI verses bilateral mastectomy.  She agreed to breast MRI,  as well as a referral to our office.  We began seeing her in June 2018 and recommended annual mammogram alternating with MRI, every 6 month breast exam and also offered her chemoprevention with tamoxifen, which she declined at that time due to her age. As she does not have a first-degree relative with pancreatic cancer screening for pancreatic cancer in patients with an ATM mutation was not recommended.  Screening mammogram in November 2019 showed possible distortion in the right breast.  Right diagnostic mammogram in December revealed a persistent large area of distortion in the lateral right breast at middle depth.  Ultrasound of this area revealed a 2 cm irregular hypoechoic mass at 9 o'clock.  MRI breast revealed architectural distortion of the right breast with margins poorly defined, but measuring approximately 2.6 cm in the lower outer quadrant.  Biopsy revealed grade 2 invasive lobular carcinoma involving all cores, with a length of to 1.2 cm.  Estrogen and  progesterone receptors were positive and HER 2 negative.  Ki 67 was 10%.    She underwent bilateral mastectomy with right sentinel lymph node biopsy in January 2020.  Pathology revealed a 2.5 cm, grade 2, invasive lobular carcinoma, which was multifocal.  There were 2 additional foci of invasive lobular carcinoma measuring 3 mm in 2 of the sections of the subareolar breast tissue.  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  One lymph node was removed which contained isolated tumor cells.  We recommended for adjuvant chemotherapy consisting of Adriamycin/cyclophosphamide.  Echocardiogram in February revealed normal left ventricular size and function with an left ventricular ejection fraction of 60-65%.  Bone density scan in February was normal.  She completed 4 cycles of Adriamycin/cyclophosphamide in March.  Adjuvant radiation was not recommended.  Echocardiogram in April revealed normal left ventricular size and function with an ejection fraction of 60-65%.  However there was an echodensity close to the tricuspid valve of uncertain significance.  She therefore had a cardiac MRI in May, which was normal.  She was placed on tamoxifen 20 mg daily June 1st 2020.  She had her port removed in May.  She had previous hysterectomy for dysfunctional uterine bleeding.  We recommended bilateral oophorectomy to decrease her risk of recurrent or new breast cancer and she had that done in August 2020.  Dr. Marla Roe started her breast reconstruction process in September 2020.  She has concerns regarding a family member (cousin) who had a double mastectomy  6 years ago, hysterectomy 3 years ago and now has recurrence of disease to her breast and liver.   INTERVAL HISTORY:  Tina Monroe is here for routine follow up and reports right knee surgery in February of this year.  The pain started after a fall, and she had been given two cortisone injections  without improvement.  She also complains of soreness of the right breast but examination reveals scar tissue and I reassured her.  She continues tamoxifen daily without significant difficulty. Blood counts and chemistries are unremarkable. Her  appetite is good, and she has gained 7 pounds since her last visit.  She denies fever, chills or other signs of infection.  She denies nausea, vomiting, bowel issues, or abdominal pain.  She denies sore throat, cough, dyspnea, or chest pain.  REVIEW OF SYSTEMS:  Review of Systems  Constitutional: Negative.  Negative for appetite change, chills, fatigue, fever and unexpected weight change.  HENT:  Negative.    Eyes: Negative.   Respiratory: Negative.  Negative for chest tightness, cough, hemoptysis, shortness of breath and wheezing.   Cardiovascular: Negative.  Negative for chest pain, leg swelling and palpitations.  Gastrointestinal: Negative.  Negative for abdominal distention, abdominal pain, blood in stool, constipation, diarrhea, nausea and vomiting.  Endocrine: Negative.   Genitourinary: Negative.  Negative for difficulty urinating, dysuria, frequency and hematuria.   Musculoskeletal:  Negative for back pain, flank pain, gait problem and myalgias. Arthralgias: worsening, of the right knee. Skin: Negative.   Neurological:  Negative for dizziness, extremity weakness (right lower extremity), gait problem, headaches, light-headedness, numbness, seizures and speech difficulty.  Hematological: Negative.   Psychiatric/Behavioral: Negative.  Negative for depression and sleep disturbance. The patient is not nervous/anxious.     VITALS:  Blood pressure (!) 144/66, pulse 64, temperature 97.8 F (36.6 C), temperature source Oral, resp. rate 18, height 5' 3" (1.6 m), weight 189 lb 3.2 oz (85.8 kg), SpO2 100 %.  Wt Readings from Last 3 Encounters:  07/05/21 189 lb 3.2 oz (85.8 kg)  01/05/21 182 lb 4.8 oz (82.7 kg)  07/04/20 170 lb 8 oz (77.3 kg)    Body mass  index is 33.52 kg/m.  Performance status (ECOG): 1 - Symptomatic but completely ambulatory  PHYSICAL EXAM:  Physical Exam Constitutional:      General: She is not in acute distress.    Appearance: Normal appearance. She is normal weight.  HENT:     Head: Normocephalic and atraumatic.  Eyes:     General: No scleral icterus.    Extraocular Movements: Extraocular movements intact.     Conjunctiva/sclera: Conjunctivae normal.     Pupils: Pupils are equal, round, and reactive to light.  Cardiovascular:     Rate and Rhythm: Normal rate and regular rhythm.     Pulses: Normal pulses.     Heart sounds: Normal heart sounds. No murmur heard.   No friction rub. No gallop.  Pulmonary:     Effort: Pulmonary effort is normal. No respiratory distress.     Breath sounds: Normal breath sounds.  Chest:     Comments: Bilateral reconstructions are negative. Abdominal:     General: Bowel sounds are normal. There is no distension.     Palpations: Abdomen is soft. There is no hepatomegaly, splenomegaly or mass.     Tenderness: There is no abdominal tenderness.  Musculoskeletal:        General: Normal range of motion.     Cervical back: Normal range of motion and neck supple.  Right lower leg: No edema.     Left lower leg: No edema.  Lymphadenopathy:     Cervical: No cervical adenopathy.  Skin:    General: Skin is warm and dry.  Neurological:     General: No focal deficit present.     Mental Status: She is alert and oriented to person, place, and time. Mental status is at baseline.  Psychiatric:        Mood and Affect: Mood normal.        Behavior: Behavior normal.        Thought Content: Thought content normal.        Judgment: Judgment normal.    LABS:      Latest Ref Rng & Units 07/05/2021   12:00 AM 01/05/2021   12:00 AM 07/01/2020   12:00 AM  CBC  WBC  6.9      6.4      5.6    Hemoglobin 12.0 - 16.0 12.6      13.2      12.6    Hematocrit 36 - 46 39      39      37     Platelets 150 - 400 K/uL 294      297      280       This result is from an external source.      Latest Ref Rng & Units 07/05/2021   12:00 AM 01/05/2021   12:00 AM 07/01/2020   12:00 AM  CMP  BUN 4 - _0 Creatinine 0.5 - 1.1 0.7      0.7      0.7    Sodium 137 - 147 140      140      137    Potassium 3.5 - 5.1 mEq/L 4.2      4.0      3.8    Chloride 99 - 108 106      109      109    CO2 13 - _1 Calcium 8.7 - 10.7 8.5      8.8      8.5    Alkaline Phos 25 - 125 92      81      81    AST 13 - 35 _2 ALT 7 - 35 U/L _3 This result is from an external source.    STUDIES:  No results found.   Allergies:  Allergies  Allergen Reactions   Dilaudid [Hydromorphone Hcl] Itching   Hydromorphone Itching    Current Medications: Current Outpatient Medications  Medication Sig Dispense Refill   tamoxifen (NOLVADEX) 20 MG tablet TAKE 1 TABLET BY MOUTH EVERY DAY 90 tablet 3   No current facility-administered medications for this visit.     ASSESSMENT & PLAN:   Assessment:   1. Stage IIA breast cancer which was ER/PR positive and HER2 negative, diagnosed in December 2019.  She completed adjuvant chemotherapy with Adriamycin/cyclophosphamide for 4 cycles in March of 2020.  She was placed on tamoxifen daily in June 2020, and will continue for a total of  5 years.  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  Unfortunately, she developed a breast cancer.  She has now undergone bilateral mastectomy with postoperative chemotherapy.  She has had a prophylactic bilateral salpingo oophorectomy so we could consider placing her on an alterative aromatase inhibitor if she cannot tolerate the tamoxifen.    Plan: She knows to continue her tamoxifen daily.  We will see her back in 6 months for repeat examination.  She verbalizes understanding of an agreement to the  plans discussed today.  She knows to call the office should any new questions or concerns arise.   I provided 15 minutes of face-to-face time during this this encounter and > 50% was spent counseling as documented under my assessment and plan.    Derwood Kaplan, MD [facility] [address] [place] [address] Dept: [phone] Dept Fax: [phone]

## 2021-09-14 ENCOUNTER — Other Ambulatory Visit: Payer: Self-pay | Admitting: Hematology and Oncology

## 2021-09-14 ENCOUNTER — Telehealth: Payer: Self-pay

## 2021-09-14 MED ORDER — FUROSEMIDE 20 MG PO TABS: 20.0000 mg | ORAL_TABLET | Freq: Every day | ORAL | 3 refills | Status: DC | PRN

## 2021-09-14 NOTE — Telephone Encounter (Signed)
-----   Message from Marvia Pickles, PA-C sent at 09/14/2021  2:15 PM EDT ----- Regarding: RE: bilateral swelling of ankles and feet I would hate to assume this is due to tamoxifen after being on it for 3 years. Would like her to see PCP for further evaluation. Thanks ----- Message ----- From: Georgette Shell, RN Sent: 09/14/2021   1:59 PM EDT To: Marvia Pickles, PA-C Subject: FW: bilateral swelling of ankles and feet       ----- Message ----- From: Georgette Shell, RN Sent: 09/13/2021   4:41 PM EDT To: Derwood Kaplan, MD Subject: bilateral swelling of ankles and feet          On going lower leg swelling x 2 weeks.  Thinks it maybe related to Tamoxifen use.  She is unable to wear regular shoes, has been wearing flip flops but they cut into her feet.  The swelling is gone some mornings but returns quickly after standing up.  Wants to know if you think she may need lasix.

## 2021-09-14 NOTE — Telephone Encounter (Signed)
I called patient she did let me know that they seem a little less swelled today.  Gave her Zachery Dauer, [place] recommendation of making appt with PCP, she was agreeable to that.  I did tell her to call back anytime.

## 2021-09-27 ENCOUNTER — Encounter (INDEPENDENT_AMBULATORY_CARE_PROVIDER_SITE_OTHER): Payer: Self-pay

## 2021-10-25 ENCOUNTER — Other Ambulatory Visit: Payer: Self-pay

## 2021-12-25 ENCOUNTER — Other Ambulatory Visit (HOSPITAL_COMMUNITY): Payer: Self-pay

## 2022-01-07 ENCOUNTER — Other Ambulatory Visit: Payer: Self-pay | Admitting: Oncology

## 2022-01-07 DIAGNOSIS — C50919 Malignant neoplasm of unspecified site of unspecified female breast: Secondary | ICD-10-CM

## 2022-01-07 NOTE — Progress Notes (Unsigned)
[facility]  [address] [place],  [place]  [address] [phone]  Clinic Day:  07/05/21  Referring physician: Maggie Schwalbe, PA-C   CHIEF COMPLAINT:  CC: Stage IIA hormone receptor positive right breast cancer  Current Treatment:  Tamoxifen daily for a total of 5 years.   HISTORY OF PRESENT ILLNESS:  Tina Monroe is a 51 y.o. 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  She had a previous right breast biopsy in August 2017, which revealed a fibroadenoma.  She has a family history of breast cancer and her sister was found to have a mutation in the ATM gene, so the patient underwent genetic testing in April 2018.  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  She was offered screening with breast MRI verses bilateral mastectomy.  She agreed to breast MRI,  as well as a referral to our office.  We began seeing her in June 2018 and recommended annual mammogram alternating with MRI, every 6 month breast exam and also offered her chemoprevention with tamoxifen, which she declined at that time due to her age. As she does not have a first-degree relative with pancreatic cancer screening for pancreatic cancer in patients with an ATM mutation was not recommended.  Screening mammogram in November 2019 showed possible distortion in the right breast.  Right diagnostic mammogram in December revealed a persistent large area of distortion in the lateral right breast at middle depth.  Ultrasound of this area revealed a 2 cm irregular hypoechoic mass at 9 o'clock.  MRI breast revealed architectural distortion of the right breast with margins poorly defined, but measuring approximately 2.6 cm in the lower outer quadrant.  Biopsy revealed grade 2 invasive lobular carcinoma involving all cores, with a length of to 1.2 cm.  Estrogen and  progesterone receptors were positive and HER 2 negative.  Ki 67 was 10%.    She underwent bilateral mastectomy with right sentinel lymph node biopsy in January 2020.  Pathology revealed a 2.5 cm, grade 2, invasive lobular carcinoma, which was multifocal.  There were 2 additional foci of invasive lobular carcinoma measuring 3 mm in 2 of the sections of the subareolar breast tissue.  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  One lymph node was removed which contained isolated tumor cells.  We recommended for adjuvant chemotherapy consisting of Adriamycin/cyclophosphamide.  Echocardiogram in February revealed normal left ventricular size and function with an left ventricular ejection fraction of 60-65%.  Bone density scan in February was normal.  She completed 4 cycles of Adriamycin/cyclophosphamide in March.  Adjuvant radiation was not recommended.  Echocardiogram in April revealed normal left ventricular size and function with an ejection fraction of 60-65%.  However there was an echodensity close to the tricuspid valve of uncertain significance.  She therefore had a cardiac MRI in May, which was normal.  She was placed on tamoxifen 20 mg daily June 1st 2020.  She had her port removed in May.  She had previous hysterectomy for dysfunctional uterine bleeding.  We recommended bilateral oophorectomy to decrease her risk of recurrent or new breast cancer and she had that done in August 2020.  Dr. Marla Roe started her breast reconstruction process in September 2020.  She has concerns regarding a family member (cousin) who had a double mastectomy  6 years ago, hysterectomy 3 years ago and now has recurrence of disease to her breast and liver.   INTERVAL HISTORY:  Tina Monroe is here for routine follow up and reports right knee surgery in February of this year.  The pain started after a fall, and she had been given two cortisone injections  without improvement.  She also complains of soreness of the right breast but examination reveals scar tissue and I reassured her.  She continues tamoxifen daily without significant difficulty. Blood counts and chemistries are unremarkable. Her  appetite is good, and she has gained 7 pounds since her last visit.  She denies fever, chills or other signs of infection.  She denies nausea, vomiting, bowel issues, or abdominal pain.  She denies sore throat, cough, dyspnea, or chest pain.  REVIEW OF SYSTEMS:  Review of Systems  Constitutional: Negative.  Negative for appetite change, chills, fatigue, fever and unexpected weight change.  HENT:  Negative.    Eyes: Negative.   Respiratory: Negative.  Negative for chest tightness, cough, hemoptysis, shortness of breath and wheezing.   Cardiovascular: Negative.  Negative for chest pain, leg swelling and palpitations.  Gastrointestinal: Negative.  Negative for abdominal distention, abdominal pain, blood in stool, constipation, diarrhea, nausea and vomiting.  Endocrine: Negative.   Genitourinary:  Negative for difficulty urinating, dysuria, frequency and hematuria.   Musculoskeletal:  Positive for flank pain. Negative for back pain, gait problem and myalgias. Arthralgias: worsening, of the right knee. Skin: Negative.   Neurological:  Negative for dizziness, extremity weakness (right lower extremity), gait problem, headaches, light-headedness, numbness, seizures and speech difficulty.  Hematological: Negative.   Psychiatric/Behavioral: Negative.  Negative for depression and sleep disturbance. The patient is not nervous/anxious.      VITALS:  There were no vitals taken for this visit.  Wt Readings from Last 3 Encounters:  07/05/21 189 lb 3.2 oz (85.8 kg)  01/05/21 182 lb 4.8 oz (82.7 kg)  07/04/20 170 lb 8 oz (77.3 kg)    There is no height or weight on file to calculate BMI.  Performance status (ECOG): 1 - Symptomatic but completely  ambulatory  PHYSICAL EXAM:  Physical Exam Constitutional:      General: She is not in acute distress.    Appearance: Normal appearance. She is normal weight.  HENT:     Head: Normocephalic and atraumatic.  Eyes:     General: No scleral icterus.    Extraocular Movements: Extraocular movements intact.     Conjunctiva/sclera: Conjunctivae normal.     Pupils: Pupils are equal, round, and reactive to light.  Cardiovascular:     Rate and Rhythm: Normal rate and regular rhythm.     Pulses: Normal pulses.     Heart sounds: Normal heart sounds. No murmur heard.    No friction rub. No gallop.  Pulmonary:     Effort: Pulmonary effort is normal. No respiratory distress.     Breath sounds: Normal breath sounds.  Chest:     Comments: Bilateral reconstructions are negative. Abdominal:     General: Bowel sounds are normal. There is no distension.     Palpations: Abdomen is soft. There is no hepatomegaly, splenomegaly or mass.     Tenderness: There is no abdominal tenderness.  Musculoskeletal:        General: Normal range of motion.     Cervical back: Normal range of motion and neck supple.     Right lower leg: No edema.     Left lower leg: No  edema.  Lymphadenopathy:     Cervical: No cervical adenopathy.  Skin:    General: Skin is warm and dry.  Neurological:     General: No focal deficit present.     Mental Status: She is alert and oriented to person, place, and time. Mental status is at baseline.  Psychiatric:        Mood and Affect: Mood normal.        Behavior: Behavior normal.        Thought Content: Thought content normal.        Judgment: Judgment normal.     LABS:      Latest Ref Rng & Units 07/05/2021   12:00 AM 01/05/2021   12:00 AM 07/01/2020   12:00 AM  CBC  WBC  6.9     6.4     5.6   Hemoglobin 12.0 - 16.0 12.6     13.2     12.6   Hematocrit 36 - 46 39     39     37   Platelets 150 - 400 K/uL 294     297     280      This result is from an external source.        Latest Ref Rng & Units 07/05/2021   12:00 AM 01/05/2021   12:00 AM 07/01/2020   12:00 AM  CMP  BUN 4 - _0 Creatinine 0.5 - 1.1 0.7     0.7     0.7   Sodium 137 - 147 140     140     137   Potassium 3.5 - 5.1 mEq/L 4.2     4.0     3.8   Chloride 99 - 108 106     109     109   CO2 13 - _1 Calcium 8.7 - 10.7 8.5     8.8     8.5   Alkaline Phos 25 - 125 92     81     81   AST 13 - 35 _2 ALT 7 - 35 U/L _3 This result is from an external source.     STUDIES:  No results found.   Allergies:  Allergies  Allergen Reactions   Dilaudid [Hydromorphone Hcl] Itching   Hydromorphone Itching    Current Medications: Current Outpatient Medications  Medication Sig Dispense Refill   furosemide (LASIX) 20 MG tablet Take 1 tablet (20 mg total) by mouth daily as needed for edema. 30 tablet 3   tamoxifen (NOLVADEX) 20 MG tablet TAKE 1 TABLET BY MOUTH EVERY DAY 90 tablet 3   No current facility-administered medications for this visit.     ASSESSMENT & PLAN:   Assessment:   1. Stage IIA breast cancer which was ER/PR positive and HER2 negative, diagnosed in December 2019.  She completed adjuvant chemotherapy with Adriamycin/cyclophosphamide for 4 cycles in March of 2020.  She was placed on tamoxifen daily in June 2020, and will continue for a total of 5 years.  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  Unfortunately, she developed a breast cancer.  She  has now undergone bilateral mastectomy with postoperative chemotherapy.  She has had a prophylactic bilateral salpingo oophorectomy so we could consider placing her on an alterative aromatase inhibitor if she cannot tolerate the tamoxifen.    Plan: She knows to continue her tamoxifen daily.  We will see her back in 6 months for repeat examination.  She verbalizes understanding of an agreement to the plans  discussed today.  She knows to call the office should any new questions or concerns arise.   I provided 15 minutes of face-to-face time during this this encounter and > 50% was spent counseling as documented under my assessment and plan.    Derwood Kaplan, MD [facility] [address] [place] [address] Dept: [phone] Dept Fax: [phone]

## 2022-01-08 ENCOUNTER — Encounter: Payer: Self-pay | Admitting: Oncology

## 2022-01-08 ENCOUNTER — Other Ambulatory Visit: Payer: Self-pay | Admitting: Oncology

## 2022-01-08 ENCOUNTER — Inpatient Hospital Stay: Payer: BC Managed Care – PPO | Attending: Oncology | Admitting: Oncology

## 2022-01-08 ENCOUNTER — Inpatient Hospital Stay: Payer: BC Managed Care – PPO

## 2022-01-08 VITALS — BP 129/70 | HR 60 | Temp 97.6°F | Resp 16 | Ht 63.0 in | Wt 196.3 lb

## 2022-01-08 DIAGNOSIS — G609 Hereditary and idiopathic neuropathy, unspecified: Secondary | ICD-10-CM | POA: Insufficient documentation

## 2022-01-08 DIAGNOSIS — Z170 Estrogen receptor positive status [ER+]: Secondary | ICD-10-CM | POA: Insufficient documentation

## 2022-01-08 DIAGNOSIS — C50919 Malignant neoplasm of unspecified site of unspecified female breast: Secondary | ICD-10-CM

## 2022-01-08 DIAGNOSIS — R600 Localized edema: Secondary | ICD-10-CM | POA: Diagnosis not present

## 2022-01-08 DIAGNOSIS — R609 Edema, unspecified: Secondary | ICD-10-CM

## 2022-01-08 DIAGNOSIS — Z780 Asymptomatic menopausal state: Secondary | ICD-10-CM

## 2022-01-08 DIAGNOSIS — C50911 Malignant neoplasm of unspecified site of right female breast: Secondary | ICD-10-CM | POA: Diagnosis present

## 2022-01-08 DIAGNOSIS — Z79810 Long term (current) use of selective estrogen receptor modulators (SERMs): Secondary | ICD-10-CM | POA: Insufficient documentation

## 2022-01-08 LAB — CMP (CANCER CENTER ONLY)
ALT: 20 U/L (ref 0–44)
AST: 20 U/L (ref 15–41)
Albumin: 3.6 g/dL (ref 3.5–5.0)
Alkaline Phosphatase: 66 U/L (ref 38–126)
Anion gap: 6 (ref 5–15)
BUN: 12 mg/dL (ref 6–20)
CO2: 26 mmol/L (ref 22–32)
Calcium: 8.8 mg/dL — ABNORMAL LOW (ref 8.9–10.3)
Chloride: 107 mmol/L (ref 98–111)
Creatinine: 0.89 mg/dL (ref 0.44–1.00)
GFR, Estimated: >60 mL/min (ref >60)
Glucose, Bld: 100 mg/dL — ABNORMAL HIGH (ref 70–99)
Potassium: 4.3 mmol/L (ref 3.5–5.1)
Sodium: 139 mmol/L (ref 135–145)
Total Bilirubin: 0.4 mg/dL (ref 0.3–1.2)
Total Protein: 6.6 g/dL (ref 6.5–8.1)

## 2022-01-08 LAB — CBC WITH DIFFERENTIAL (CANCER CENTER ONLY)
Abs Immature Granulocytes: 0.04 10*3/uL (ref 0.00–0.07)
Basophils Absolute: 0.1 10*3/uL (ref 0.0–0.1)
Basophils Relative: 2 %
Eosinophils Absolute: 0.2 10*3/uL (ref 0.0–0.5)
Eosinophils Relative: 3 %
HCT: 37.9 % (ref 36.0–46.0)
Hemoglobin: 12.4 g/dL (ref 12.0–15.0)
Immature Granulocytes: 1 %
Lymphocytes Relative: 29 %
Lymphs Abs: 1.9 10*3/uL (ref 0.7–4.0)
MCH: 29.5 pg (ref 26.0–34.0)
MCHC: 32.7 g/dL (ref 30.0–36.0)
MCV: 90.2 fL (ref 80.0–100.0)
Monocytes Absolute: 0.7 10*3/uL (ref 0.1–1.0)
Monocytes Relative: 10 %
Neutro Abs: 3.8 10*3/uL (ref 1.7–7.7)
Neutrophils Relative %: 55 %
Platelet Count: 320 10*3/uL (ref 150–400)
RBC: 4.2 MIL/uL (ref 3.87–5.11)
RDW: 12.5 % (ref 11.5–15.5)
WBC Count: 6.7 10*3/uL (ref 4.0–10.5)
nRBC: 0 % (ref 0.0–0.2)

## 2022-01-08 NOTE — Progress Notes (Signed)
[facility]  [address] 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   HISTORY OF PRESENT ILLNESS:  Tina Monroe is a 51 y.o. 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  She had a previous right breast biopsy in August 2017, which revealed a fibroadenoma.  She has a family history of breast cancer and her sister was found to have a mutation in the ATM gene, so the patient underwent genetic testing in April 2018.  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  She was offered screening with breast MRI verses bilateral mastectomy.  She agreed to breast MRI,  as well as a referral to our office.  We began seeing her in June 2018 and recommended annual mammogram alternating with MRI, every 6 month breast exam and also offered her chemoprevention with tamoxifen, which she declined at that time due to her 51. As she does not have a first-degree relative with pancreatic cancer screening for pancreatic cancer in patients with an ATM mutation was not recommended.  Screening mammogram in November 2019 showed possible distortion in the right breast.  Right diagnostic mammogram in December revealed a persistent large area of distortion in the lateral right breast at middle depth.  Ultrasound of this area revealed a 2 cm irregular hypoechoic mass at 9 o'clock.  MRI breast revealed architectural distortion of the right breast with margins poorly defined, but measuring approximately 2.6 cm in the lower outer quadrant.  Biopsy revealed grade 2 invasive lobular carcinoma involving all cores, with a length of to 1.2 cm.  Estrogen  and progesterone receptors were positive and HER 2 negative.  Ki 67 was 10%.     She underwent bilateral mastectomy with right sentinel lymph node biopsy in January 2020.  Pathology revealed a 2.5 cm, grade 2, invasive lobular carcinoma, which was multifocal.  There were 2 additional foci of invasive lobular carcinoma measuring 3 mm in 2 of the sections of the subareolar breast tissue.  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  One lymph node was removed which contained isolated tumor cells.  We recommended for adjuvant chemotherapy consisting of Adriamycin/cyclophosphamide.  Echocardiogram in February revealed normal left ventricular size and function with an left ventricular ejection fraction of 60-65%.  Bone density scan in February was normal.  She completed 4 cycles of Adriamycin/cyclophosphamide in March.  Adjuvant radiation was not recommended.  Echocardiogram in April revealed normal left ventricular size and function with an ejection fraction of 60-65%.  However there was an echodensity close to the tricuspid valve of uncertain significance.  She therefore had a cardiac MRI in May, which was normal.  She was placed on tamoxifen 20 mg daily June 1st 2020.  She had her port removed in May.  She had previous hysterectomy for dysfunctional uterine bleeding.  We recommended bilateral oophorectomy to decrease her risk of recurrent or new breast cancer and she had that done in August 2020.  Dr. Marla Roe started her breast reconstruction process in September 2020.  She has concerns regarding a family member (  cousin) who had a double mastectomy 6 years ago, hysterectomy 3 years ago and now has recurrence of disease to her breast and liver.    INTERVAL HISTORY:  Tina Monroe is here for routine follow up for Stage IIA hormone receptor positive right breast cancer. Overall she has been doing well. However, she is having a pins and  needle sensation which radiates from her bilateral feet up to just below the knees. This pain waxes and wanes and is exacerbated by walking at times. She reports some right foot tingling initially after chemo but nothing this severe. The pain is severe at night and often keeps her awake. She has been compliant with daily Tamoxifen but feels that this is causing weight gain and may be the cause of her neuropathy. She reports BLE swelling that began over the Summer and has persisted since that time. She states that she does stand a lot at work but this is not a recent change for her. She has seen her PCP PA Nodal many times about her symptoms. He recommended an echo but she declined. She denies any elevated blood sugars. She does not drink alcohol. She denies any new medication changes. She declined offer for trial of Gabapentin at this time. She denies signs of infection such as sore throat, sinus drainage, cough, or urinary symptoms.  She denies fevers or recurrent chills. She denies nausea, vomiting, chest pain, dyspnea or cough. Her weight has increased 7 pounds over last 6 months . She is scheduled for a consult with weight loss clinic next week.   REVIEW OF SYSTEMS:  Review of Systems  Constitutional:  Negative for appetite change, chills, fever and unexpected weight change.  HENT:  Negative.  Negative for lump/mass, mouth sores and sore throat.   Eyes: Negative.   Respiratory: Negative.  Negative for chest tightness, cough, hemoptysis, shortness of breath and wheezing.   Cardiovascular:  Positive for leg swelling. Negative for chest pain and palpitations.  Gastrointestinal: Negative.  Negative for abdominal distention, abdominal pain, blood in stool, constipation, diarrhea, nausea and vomiting.  Endocrine: Negative.   Genitourinary: Negative.  Negative for difficulty urinating, dysuria, frequency and hematuria.   Musculoskeletal:  Positive for myalgias (BLE neuropathic pain). Negative for back pain,  flank pain and gait problem.  Skin: Negative.   Neurological:  Negative for dizziness, extremity weakness, gait problem, headaches, light-headedness, seizures and speech difficulty.  Hematological: Negative.  Negative for adenopathy. Does not bruise/bleed easily.  Psychiatric/Behavioral:  Positive for sleep disturbance. Negative for depression. The patient is not nervous/anxious.       VITALS:  Blood pressure 129/70, pulse 60, temperature 97.6 F (36.4 C), temperature source Oral, resp. rate 16, height _0  (1.6 m), weight 196 lb 4.8 oz (89 kg), SpO2 99 %.     Wt Readings from Last 3 Encounters:  01/08/22 196 lb 4.8 oz (89 kg)  07/05/21 189 lb 3.2 oz (85.8 kg)  01/05/21 182 lb 4.8 oz (82.7 kg)    Body mass index is 34.77 kg/m.   Performance status (ECOG): 1 - Symptomatic but completely ambulatory   PHYSICAL EXAM:  Physical Exam Constitutional:      General: She is not in acute distress.    Appearance: Normal appearance. She is normal weight.  HENT:     Head: Normocephalic and atraumatic.  Eyes:     General: No scleral icterus.    Extraocular Movements: Extraocular movements intact.     Conjunctiva/sclera: Conjunctivae normal.     Pupils: Pupils  are equal, round, and reactive to light.  Cardiovascular:     Rate and Rhythm: Normal rate and regular rhythm.     Pulses: Normal pulses.     Heart sounds: Normal heart sounds. No murmur heard.    No friction rub. No gallop.  Pulmonary:     Effort: Pulmonary effort is normal. No respiratory distress.     Breath sounds: Normal breath sounds.  Chest:  Breasts:    Right: No mass.     Left: No mass.     Comments: Bilateral reconstructions are negative. Abdominal:     General: Bowel sounds are normal. There is no distension.     Palpations: Abdomen is soft. There is no hepatomegaly, splenomegaly or mass.     Tenderness: There is no abdominal tenderness.  Musculoskeletal:        General: Normal range of motion.     Cervical back:  Normal range of motion and neck supple.     Right lower leg: 1+ Edema present.     Left lower leg: 1+ Edema present.  Lymphadenopathy:     Cervical: No cervical adenopathy.     Right cervical: No superficial, deep or posterior cervical adenopathy.    Left cervical: No superficial, deep or posterior cervical adenopathy.     Upper Body:     Right upper body: No supraclavicular, axillary or pectoral adenopathy.     Left upper body: No supraclavicular, axillary or pectoral adenopathy.  Skin:    General: Skin is warm and dry.  Neurological:     General: No focal deficit present.     Mental Status: She is alert and oriented to person, place, and time. Mental status is at baseline.  Psychiatric:        Mood and Affect: Mood normal.        Behavior: Behavior normal.        Thought Content: Thought content normal.        Judgment: Judgment normal.        LABS:        Latest Ref Rng & Units 07/05/2021   12:00 AM 01/05/2021   12:00 AM 07/01/2020   12:00 AM  CBC  WBC   6.9     6.4     5.6   Hemoglobin 12.0 - 16.0 12.6     13.2     12.6   Hematocrit 36 - 46 39     39     37   Platelets 150 - 400 K/uL 294     297     280      This result is from an external source.        Latest Ref Rng & Units 07/05/2021   12:00 AM 01/05/2021   12:00 AM 07/01/2020   12:00 AM  CMP  BUN 4 - _0 Creatinine 0.5 - 1.1 0.7     0.7     0.7   Sodium 137 - 147 140     140     137   Potassium 3.5 - 5.1 mEq/L 4.2     4.0     3.8   Chloride 99 - 108 106     109     109   CO2 13 - _1 Calcium 8.7 - 10.7 8.5     8.8  8.5   Alkaline Phos 25 - 125 92     81     81   AST 13 - 35 _0 ALT 7 - 35 U/L _1 This result is from an external source.      STUDIES:  Imaging Results  No results found.      Allergies:      Allergies  Allergen Reactions   Hydromorphone Itching and Other (See Comments)   Hydromorphone Hcl Itching and Other  (See Comments)      Current Medications:       Current Outpatient Medications  Medication Sig Dispense Refill   furosemide (LASIX) 20 MG tablet Take 1 tablet (20 mg total) by mouth daily as needed for edema. 30 tablet 3   tamoxifen (NOLVADEX) 20 MG tablet TAKE 1 TABLET BY MOUTH EVERY DAY 90 tablet 3    No current facility-administered medications for this visit.        ASSESSMENT & PLAN:    Assessment:   1. Stage IIA breast cancer which was ER/PR positive and HER2 negative, diagnosed in December 2019.  She completed adjuvant chemotherapy with Adriamycin/cyclophosphamide for 4 cycles in March of 2020.  She was placed on tamoxifen daily in June 2020, and will continue for a total of 5 years. We discussed her risk profile if she discontinues Tamoxifen early.    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  Unfortunately, she developed a breast cancer.  She has now undergone bilateral mastectomy with postoperative chemotherapy.  She has had a prophylactic bilateral salpingo oophorectomy so we could consider placing her on an alterative aromatase inhibitor if she cannot tolerate the tamoxifen.     3. Idiopathic neuropathy. She complains of BLE pins/needles with worsened pain at night. We discussed trialing Gabapentin 164m at bedtime. She would like to hold off on trying this at this time. She plans to work on weight reduction in the meantime as she suspects this is playing a role. It is possible the Tamoxifen is causing this.   4. BLE edema.  She reports BLE swelling since this Summer. She had 1+ BLE edema today. Her PCP recommended an echocardiogram which she wanted to avoid until seeing me. Her BNP was 71 on 11/03/2021. We will proceed with the echocardiogram given her prior treatment with Adriamycin/cyclophosphamide.   5. Hypocalcemia Calcium 9.1 on 11/24/2021. Advised to take supplemental Calcium with Vitamin D BID. Will order a bone density  scan.     Plan: She knows to continue her tamoxifen daily.  We will refer her to a neurologist for opinion regarding her neuropathic symptoms. We will proceed with getting an echocardiogram. Encouraged to start calcium/vitam D supplementation. We will get a bone density scan. We will see her back in 6 months for repeat examination.  She verbalizes understanding of an agreement to the plans discussed today.  She knows to call the office should any new questions or concerns arise.     I provided 25 minutes of face-to-face time during this this encounter and > 50% was spent counseling as documented under my assessment and plan.      CDerwood Kaplan MD CSent[facility] CenterAT AM[id number]: [phone] Fax: [phone]  I,Alexis Herring,acting as a scribe for Derwood Kaplan, MD.,have documented all relevant documentation on the behalf of Derwood Kaplan, MD,as directed by  Derwood Kaplan, MD while in the presence of Derwood Kaplan, MD.   I have reviewed this report as typed by the medical scribe, and it is complete and accurate.   Alexis Herring   01/08/22 5:05 PM

## 2022-01-09 ENCOUNTER — Telehealth: Payer: Self-pay

## 2022-01-09 ENCOUNTER — Other Ambulatory Visit: Payer: Self-pay

## 2022-01-09 DIAGNOSIS — C50919 Malignant neoplasm of unspecified site of unspecified female breast: Secondary | ICD-10-CM

## 2022-01-09 DIAGNOSIS — C50511 Malignant neoplasm of lower-outer quadrant of right female breast: Secondary | ICD-10-CM

## 2022-01-09 NOTE — Telephone Encounter (Signed)
-----   Message from Derwood Kaplan, MD sent at 01/08/2022  6:08 PM EST ----- Regarding: refer Refer Neuro for new idiopathic neuropathy

## 2022-01-09 NOTE — Telephone Encounter (Signed)
Referral sent electronically.

## 2022-01-09 NOTE — Telephone Encounter (Signed)
-----   Message from Derwood Kaplan, MD sent at 01/08/2022  7:18 PM EST ----- Regarding: call Tell her blood counts normal, BS perfect at 100, rest looks good.  Calcium still a little low but better so could just take 1 calcium daily.

## 2022-01-10 ENCOUNTER — Encounter: Payer: Self-pay | Admitting: Neurology

## 2022-01-18 ENCOUNTER — Ambulatory Visit: Payer: BC Managed Care – PPO | Admitting: Neurology

## 2022-01-26 ENCOUNTER — Ambulatory Visit: Payer: BC Managed Care – PPO

## 2022-01-29 ENCOUNTER — Other Ambulatory Visit: Payer: Self-pay | Admitting: Oncology

## 2022-01-29 ENCOUNTER — Other Ambulatory Visit: Payer: Self-pay | Admitting: Hematology and Oncology

## 2022-01-29 ENCOUNTER — Telehealth: Payer: Self-pay

## 2022-01-29 DIAGNOSIS — Z170 Estrogen receptor positive status [ER+]: Secondary | ICD-10-CM

## 2022-01-29 DIAGNOSIS — G629 Polyneuropathy, unspecified: Secondary | ICD-10-CM

## 2022-01-29 MED ORDER — GABAPENTIN 300 MG PO CAPS: 300.0000 mg | ORAL_CAPSULE | Freq: Three times a day (TID) | ORAL | 5 refills | Status: DC

## 2022-01-29 MED ORDER — GABAPENTIN 100 MG PO CAPS: 100.0000 mg | ORAL_CAPSULE | Freq: Three times a day (TID) | ORAL | 5 refills | Status: DC

## 2022-01-29 NOTE — Telephone Encounter (Signed)
Pt called to report she has decided to try the gabapentin '100mg'$ . Please send to [facility]

## 2022-02-08 ENCOUNTER — Ambulatory Visit: Payer: BC Managed Care – PPO | Attending: Cardiology

## 2022-02-08 DIAGNOSIS — R600 Localized edema: Secondary | ICD-10-CM | POA: Diagnosis not present

## 2022-02-08 DIAGNOSIS — R609 Edema, unspecified: Secondary | ICD-10-CM

## 2022-02-08 LAB — ECHOCARDIOGRAM COMPLETE
Area-P 1/2: 3.99 cm2
S' Lateral: 3.1 cm

## 2022-02-16 ENCOUNTER — Telehealth: Payer: Self-pay

## 2022-02-16 NOTE — Telephone Encounter (Signed)
-----   Message from Derwood Kaplan, MD sent at 02/15/2022  6:19 PM EST ----- Regarding: call Tell her echocardiogram looks good and heart is not the cause of her bilat LE edema. Send copy to her PCP

## 2022-02-16 NOTE — Telephone Encounter (Signed)
Patient notified of Echocardiogram results. Copy sent to PCP.

## 2022-03-07 ENCOUNTER — Ambulatory Visit: Payer: BC Managed Care – PPO | Admitting: Neurology

## 2022-05-02 ENCOUNTER — Encounter: Payer: Self-pay | Admitting: Oncology

## 2022-07-06 NOTE — Progress Notes (Signed)
[name]  [address] 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   HISTORY OF PRESENT ILLNESS:  Tina Monroe is a 52 y.o. 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  She had a previous right breast biopsy in August 2017, which revealed a fibroadenoma.  She has a family history of breast cancer and her sister was found to have a mutation in the ATM gene, so the patient underwent genetic testing in April 2018.  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  She was offered screening with breast MRI verses bilateral mastectomy.  She agreed to breast MRI,  as well as a referral to our office.  We began seeing her in June 2018 and recommended annual mammogram alternating with MRI, every 6 month breast exam and also offered her chemoprevention with tamoxifen, which she declined at that time due to her age. As she does not have a first-degree relative with pancreatic cancer screening for pancreatic cancer in patients with an ATM mutation was not recommended.  Screening mammogram in November 2019 showed possible distortion in the right breast.  Right diagnostic mammogram in December revealed a persistent large area of distortion in the lateral right breast at middle depth.  Ultrasound of this area revealed a 2 cm irregular hypoechoic mass at 9 o'clock.  MRI breast revealed architectural distortion of the right breast with margins poorly defined, but measuring approximately 2.6 cm in the lower outer quadrant.  Biopsy revealed grade 2 invasive lobular carcinoma involving all cores, with a length of to 1.2 cm.  Estrogen  and progesterone receptors were positive and HER 2 negative.  Ki 67 was 10%.     She underwent bilateral mastectomy with right sentinel lymph node biopsy in January 2020.  Pathology revealed a 2.5 cm, grade 2, invasive lobular carcinoma, which was multifocal.  There were 2 additional foci of invasive lobular carcinoma measuring 3 mm in 2 of the sections of the subareolar breast tissue.  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  One lymph node was removed which contained isolated tumor cells.  We recommended for adjuvant chemotherapy consisting of Adriamycin/cyclophosphamide.  Echocardiogram in February revealed normal left ventricular size and function with an left ventricular ejection fraction of 60-65%.  Bone density scan in February was normal.  She completed 4 cycles of Adriamycin/cyclophosphamide in March.  Adjuvant radiation was not recommended.  Echocardiogram in April revealed normal left ventricular size and function with an ejection fraction of 60-65%.  However there was an echodensity close to the tricuspid valve of uncertain significance.  She therefore had a cardiac MRI in May, which was normal.  She was placed on tamoxifen 20 mg daily June 1st 2020.  She had her port removed in May.  She had previous hysterectomy for dysfunctional uterine bleeding.  We recommended bilateral oophorectomy to decrease her risk of recurrent or new breast cancer and she had that done in August 2020.  Dr. Ulice Bold started her breast reconstruction process in September 2020.  She has concerns regarding a family member (cousin) who  had a double mastectomy 6 years ago, hysterectomy 3 years ago and now has recurrence of disease to her breast and liver.    INTERVAL HISTORY:  Tina Monroe is here for routine follow up for Stage IIA hormone receptor positive right breast cancer, diagnosed in December, 2019. Patient states that she feels well and  complains of no pain. She had a bone density scan in March, 2024 that was normal.  She informed me that one of her daughters had genetic testing done and didn't have the ATM mutation, she is currently waiting on her other two to have the testing. I will see her back in 6 months with CBC and CMP. She denies signs of infection such as sore throat, sinus drainage, cough, or urinary symptoms.  She denies fevers or recurrent chills. She denies pain. She denies nausea, vomiting, chest pain, dyspnea or cough. Her appetite is good and her weight has decreased 7 pounds over last 6 months . She continues to try to lose weight, as she feels the Tamoxifen has caused her to gain.    REVIEW OF SYSTEMS:  Review of Systems  Constitutional: Negative.  Negative for appetite change, chills, diaphoresis, fatigue, fever and unexpected weight change.  HENT:  Negative.  Negative for hearing loss, lump/mass, mouth sores, nosebleeds, sore throat, tinnitus, trouble swallowing and voice change.   Eyes: Negative.  Negative for eye problems and icterus.  Respiratory: Negative.  Negative for chest tightness, cough, hemoptysis, shortness of breath and wheezing.   Cardiovascular: Negative.  Negative for chest pain, leg swelling and palpitations.  Gastrointestinal: Negative.  Negative for abdominal distention, abdominal pain, blood in stool, constipation, diarrhea, nausea, rectal pain and vomiting.  Endocrine: Negative.   Genitourinary: Negative.  Negative for bladder incontinence, difficulty urinating, dyspareunia, dysuria, frequency, hematuria, menstrual problem, nocturia, pelvic pain, vaginal bleeding and vaginal discharge.   Musculoskeletal:  Positive for myalgias (BLE neuropathic pain). Negative for arthralgias, back pain, flank pain, gait problem, neck pain and neck stiffness.  Skin: Negative.  Negative for itching, rash and wound.  Neurological:  Negative for dizziness, extremity weakness, gait problem, headaches,  light-headedness, numbness, seizures and speech difficulty.  Hematological: Negative.  Negative for adenopathy. Does not bruise/bleed easily.  Psychiatric/Behavioral:  Positive for sleep disturbance. Negative for confusion, decreased concentration, depression and suicidal ideas. The patient is not nervous/anxious.    VITALS:  Blood pressure 129/70, pulse 60, temperature 97.6 F (36.4 C), temperature source Oral, resp. rate 16, height 5\' 3"  (1.6 m), weight 196 lb 4.8 oz (89 kg), SpO2 99 %.     Wt Readings from Last 3 Encounters:  01/08/22 196 lb 4.8 oz (89 kg)  07/05/21 189 lb 3.2 oz (85.8 kg)  01/05/21 182 lb 4.8 oz (82.7 kg)    Body mass index is 34.77 kg/m.   Performance status (ECOG): 1 - Symptomatic but completely ambulatory   PHYSICAL EXAM:  Physical Exam Vitals and nursing note reviewed.  Constitutional:      General: She is not in acute distress.    Appearance: Normal appearance. She is normal weight. She is not ill-appearing, toxic-appearing or diaphoretic.  HENT:     Head: Normocephalic and atraumatic.     Right Ear: Tympanic membrane, ear canal and external ear normal. There is no impacted cerumen.     Left Ear: Tympanic membrane, ear canal and external ear normal. There is no impacted cerumen.     Nose: Nose normal. No congestion or rhinorrhea.     Mouth/Throat:  Mouth: Mucous membranes are moist.     Pharynx: Oropharynx is clear. No oropharyngeal exudate or posterior oropharyngeal erythema.  Eyes:     General: No scleral icterus.       Right eye: No discharge.        Left eye: No discharge.     Extraocular Movements: Extraocular movements intact.     Conjunctiva/sclera: Conjunctivae normal.     Pupils: Pupils are equal, round, and reactive to light.  Neck:     Vascular: No carotid bruit.  Cardiovascular:     Rate and Rhythm: Normal rate and regular rhythm.     Pulses: Normal pulses.     Heart sounds: Normal heart sounds. No murmur heard.    No friction rub.  No gallop.  Pulmonary:     Effort: Pulmonary effort is normal. No respiratory distress.     Breath sounds: Normal breath sounds. No stridor. No wheezing, rhonchi or rales.  Chest:     Chest wall: No tenderness.     Comments: Bilateral reconstructions are negative. Abdominal:     General: Bowel sounds are normal. There is no distension.     Palpations: Abdomen is soft. There is no hepatomegaly, splenomegaly or mass.     Tenderness: There is no abdominal tenderness. There is no right CVA tenderness, left CVA tenderness, guarding or rebound.     Hernia: No hernia is present.  Musculoskeletal:        General: No swelling, tenderness, deformity or signs of injury. Normal range of motion.     Cervical back: Normal range of motion and neck supple. No rigidity or tenderness.     Right lower leg: No edema.     Left lower leg: No edema.  Lymphadenopathy:     Cervical: No cervical adenopathy.     Right cervical: No superficial, deep or posterior cervical adenopathy.    Left cervical: No superficial, deep or posterior cervical adenopathy.     Upper Body:     Right upper body: No supraclavicular, axillary or pectoral adenopathy.     Left upper body: No supraclavicular, axillary or pectoral adenopathy.  Skin:    General: Skin is warm and dry.     Coloration: Skin is not jaundiced or pale.     Findings: No bruising, erythema, lesion or rash.  Neurological:     General: No focal deficit present.     Mental Status: She is alert and oriented to person, place, and time. Mental status is at baseline.     Cranial Nerves: No cranial nerve deficit.     Sensory: No sensory deficit.     Motor: No weakness.     Coordination: Coordination normal.     Gait: Gait normal.     Deep Tendon Reflexes: Reflexes normal.  Psychiatric:        Mood and Affect: Mood normal.        Behavior: Behavior normal.        Thought Content: Thought content normal.        Judgment: Judgment normal.    LABS:    CBC  11/03/2021  WBC 4.4 - 11.0 x 10*3/uL 6.9  RBC 4.10 - 5.10 x 10*6/uL 4.33  Hemoglobin 12.3 - 15.3 G/DL 16.1  Hematocrit 09.6 - 44.6 % 37.6  MCV 80.0 - 96.0 FL 86.9  MCH 27.5 - 33.2 PG 29.8  MCHC 33.0 - 37.0 G/DL 04.5  RDW 40.9 - 81.1 % 13.2  Platelets 150 - 450 X 10*3/uL 308  CMP 11/24/2021  Sodium 135 - 146 MMOL/L 140  Potassium 3.5 - 5.3 MMOL/L 4.6  Chloride 98 - 110 MMOL/L 108  CO2 21 - 31 MMOL/L 27  BUN 8 - 24 MG/DL 14  Glucose 70 - 99 MG/DL 94  Creatinine 1.61 - 0.96 MG/DL 0.45  Calcium 8.5 - 40.9 MG/DL 9.1  Anion Gap 4 - 14 MMOL/L 5  Est. GFR >=60 ML/MIN/1.73 M*2 87   STUDIES:     History: Past Medical History:  Diagnosis Date   Cancer (HCC)    breast cancer   Constipation    GERD (gastroesophageal reflux disease)    Joint pain    SOB (shortness of breath)    Swelling of both lower extremities    Past Surgical History:  Procedure Laterality Date   ABDOMINAL HYSTERECTOMY  09/25/2018   partial this year, other partial in 2003   BREAST RECONSTRUCTION WITH PLACEMENT OF TISSUE EXPANDER AND FLEX HD (ACELLULAR HYDRATED DERMIS) Bilateral 10/23/2018   Procedure: BREAST RECONSTRUCTION WITH PLACEMENT OF TISSUE EXPANDER AND FLEX HD (ACELLULAR HYDRATED DERMIS);  Surgeon: Peggye Form, DO;  Location: [facility];  Service: Plastics;  Laterality: Bilateral;  2.5 hours   CARDIAC CATHETERIZATION     post chemo, everything normal   HYSTERECTOMY ABDOMINAL WITH SALPINGECTOMY  2021   MASTECTOMY  02/2018   MENISCUS REPAIR  2000   PORTA CATH INSERTION  02/2018   PORTA CATH REMOVAL     REMOVAL OF BILATERAL TISSUE EXPANDERS WITH PLACEMENT OF BILATERAL BREAST IMPLANTS Bilateral 01/22/2019   Procedure: REMOVAL OF BILATERAL TISSUE EXPANDERS WITH PLACEMENT OF BILATERAL BREAST IMPLANTS;  Surgeon: Peggye Form, DO;  Location: [facility];  Service: Plastics;  Laterality: Bilateral;   torn miniscus right knee  2012   Family  History  Problem Relation Age of Onset   Thyroid disease Mother    Obesity Mother    Cancer Father    Social History   Substance and Sexual Activity  Drug Use No   Social History   Tobacco Use  Smoking Status Never  Smokeless Tobacco Never   Social History   Substance and Sexual Activity  Alcohol Use Not Currently   Allergies as of 07/10/2022       Reactions   Hydromorphone Itching, Other (See Comments)   Hydromorphone Hcl Itching, Other (See Comments)        Medication List        Accurate as of Jul 10, 2022 11:59 PM. If you have any questions, ask your nurse or doctor.          furosemide 20 MG tablet Commonly known as: LASIX Take 1 tablet (20 mg total) by mouth daily as needed for edema.   gabapentin 100 MG capsule Commonly known as: Neurontin Take 1 capsule (100 mg total) by mouth 3 (three) times daily. Start with 1 capsule by mouth at bedtime only   tamoxifen 20 MG tablet Commonly known as: NOLVADEX TAKE 1 TABLET BY MOUTH EVERY DAY        ASSESSMENT & PLAN:   Assessment:   1. Stage IIA breast cancer which was ER/PR positive and HER2 negative, diagnosed in December 2019.  She completed adjuvant chemotherapy with Adriamycin/cyclophosphamide for 4 cycles in March of 2020.  She was placed on tamoxifen daily in June 2020, and will continue for a total of 5-10 years.    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  Unfortunately, she developed a breast cancer.  She has now undergone bilateral mastectomy with postoperative chemotherapy.  She has had a prophylactic bilateral salpingo oophorectomy so we could consider placing her on an alterative aromatase inhibitor if she cannot tolerate the tamoxifen.     3. Idiopathic neuropathy. She complains of BLE pins/needles with worsened pain at night. We discussed trialing Gabapentin 100mg  at bedtime. She would like to hold off on trying that at this time. She plans  to work on weight reduction in the meantime as she suspects this is playing a role. It is possible the Tamoxifen is aggravating this.   4. Hypocalcemia Calcium 9.1 on 11/24/2021. Advised to take supplemental Calcium with Vitamin D BID. Her bone density scan in March, 2024 was normal and we will recheck labs in the Fall.      Plan: She knows to continue her tamoxifen daily, she continues to try to lose weight as she thinks the Tamoxifen causes her to gain.  She had a bone density scan in March, 2024 that was normal.  She informed me that one of her daughters had genetic testing done and didn't have the ATM mutation, she I currently waiting on her other two to have it checked. I will see her back in 6 months with CBC and CMP. She verbalizes understanding of an agreement to the plans discussed today.  She knows to call the office should any new questions or concerns arise.     I provided 10 minutes of face-to-face time during this this encounter and > 50% was spent counseling as documented under my assessment and plan.      Dellia Beckwith, MD [facility] [address] [place] [address] Dept: [phone] Dept Fax: [phone]       Rulon Sera Lassiter,acting as a scribe for Dellia Beckwith, MD.,have documented all relevant documentation on the behalf of Dellia Beckwith, MD,as directed by  Dellia Beckwith, MD while in the presence of Dellia Beckwith, MD.  I have reviewed this report as typed by the medical scribe, and it is complete and accurate.

## 2022-07-09 ENCOUNTER — Ambulatory Visit: Payer: BC Managed Care – PPO | Admitting: Oncology

## 2022-07-10 ENCOUNTER — Inpatient Hospital Stay: Payer: BC Managed Care – PPO | Attending: Oncology | Admitting: Oncology

## 2022-07-10 ENCOUNTER — Encounter: Payer: Self-pay | Admitting: Oncology

## 2022-07-10 ENCOUNTER — Other Ambulatory Visit: Payer: Self-pay | Admitting: Oncology

## 2022-07-10 VITALS — BP 131/77 | HR 64 | Temp 97.6°F | Resp 18 | Ht 63.0 in | Wt 189.6 lb

## 2022-07-10 DIAGNOSIS — C50919 Malignant neoplasm of unspecified site of unspecified female breast: Secondary | ICD-10-CM

## 2022-07-10 DIAGNOSIS — Z1509 Genetic susceptibility to other malignant neoplasm: Secondary | ICD-10-CM

## 2022-07-10 DIAGNOSIS — C50511 Malignant neoplasm of lower-outer quadrant of right female breast: Secondary | ICD-10-CM

## 2022-07-10 DIAGNOSIS — Z1501 Genetic susceptibility to malignant neoplasm of breast: Secondary | ICD-10-CM

## 2022-07-10 DIAGNOSIS — Z170 Estrogen receptor positive status [ER+]: Secondary | ICD-10-CM

## 2022-07-12 ENCOUNTER — Telehealth: Payer: Self-pay | Admitting: Oncology

## 2022-07-12 NOTE — Telephone Encounter (Signed)
07/12/22 Spoke with patient and scheduled next appt on 01/11/23@4pm 

## 2022-12-27 NOTE — Progress Notes (Incomplete)
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   HISTORY OF PRESENT ILLNESS:  Tina Monroe is Monroe 52 y.o. 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  She had Monroe previous right breast biopsy in August 2017, which revealed Monroe fibroadenoma.  She has Monroe family history of breast cancer and her sister was found to have Monroe mutation in the ATM gene, so the patient underwent genetic testing in April 2018.  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  She was offered screening with breast MRI verses bilateral mastectomy.  She agreed to breast MRI,  as well as Monroe referral to our office.  We began seeing her in June 2018 and recommended annual mammogram alternating with MRI, every 6 month breast exam and also offered her chemoprevention with tamoxifen, which she declined at that time due to her age. As she does not have Monroe first-degree relative with pancreatic cancer screening for pancreatic cancer in patients with an ATM mutation was not recommended.  Screening mammogram in November 2019 showed possible distortion in the right breast.  Right diagnostic mammogram in December revealed Monroe persistent large area of distortion in the lateral right breast at middle depth.  Ultrasound of this area revealed Monroe 2 cm irregular hypoechoic mass at 9 o'clock.  MRI breast revealed architectural distortion of the right breast with margins poorly defined, but measuring approximately 2.6 cm in the lower outer quadrant.  Biopsy revealed grade 2 invasive lobular carcinoma involving all cores, with Monroe length of to 1.2 cm.  Estrogen  and progesterone receptors were positive and HER 2 negative.  Ki 67 was 10%.     She underwent bilateral mastectomy with right sentinel lymph node biopsy in January 2020.  Pathology revealed Monroe 2.5 cm, grade 2, invasive lobular carcinoma, which was multifocal.  There were 2 additional foci of invasive lobular carcinoma measuring 3 mm in 2 of the sections of the subareolar breast tissue.  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  One lymph node was removed which contained isolated tumor cells.  We recommended for adjuvant chemotherapy consisting of Adriamycin/cyclophosphamide.  Echocardiogram in February revealed normal left ventricular size and function with an left ventricular ejection fraction of 60-65%.  Bone density scan in February was normal.  She completed 4 cycles of Adriamycin/cyclophosphamide in March.  Adjuvant radiation was not recommended.  Echocardiogram in April revealed normal left ventricular size and function with an ejection fraction of 60-65%.  However there was an echodensity close to the tricuspid valve of uncertain significance.  She therefore had Monroe cardiac MRI in May, which was normal.  She was placed on tamoxifen 20 mg daily June 1st 2020.  She had her port removed in May.  She had previous hysterectomy for dysfunctional uterine bleeding.  We recommended bilateral oophorectomy to decrease her risk of recurrent or new breast cancer and she had that done in August 2020.  Dr. Ulice Bold started her breast reconstruction process in September 2020.  She has concerns regarding Monroe family member (cousin) who  had Monroe double mastectomy 6 years ago, hysterectomy 3 years ago and now has recurrence of disease to her breast and liver.    INTERVAL HISTORY:  [name] is here for routine follow up for Stage IIA hormone receptor positive right breast cancer, diagnosed in December, 2019. Patient states that she feels *** and  ***.    She denies signs of infection such as sore throat, sinus drainage, cough, or urinary symptoms.  She denies fevers or recurrent chills. She denies pain. She denies nausea, vomiting, chest pain, dyspnea or cough. Her appetite is *** and her weight {Weight change:10426}.  Patient states that she feels well and complains of no pain. She had Monroe bone density scan in March, 2024 that was normal.  She informed me that one of her daughters had genetic testing done and didn't have the ATM mutation, she is currently waiting on her other two to have the testing. I will see her back in 6 months with CBC and CMP. She denies signs of infection such as sore throat, sinus drainage, cough, or urinary symptoms.  She denies fevers or recurrent chills. She denies pain. She denies nausea, vomiting, chest pain, dyspnea or cough. Her appetite is good and her weight has decreased 7 pounds over last 6 months . She continues to try to lose weight, as she feels the Tamoxifen has caused her to gain.    REVIEW OF SYSTEMS:  Review of Systems  Constitutional: Negative.  Negative for appetite change, chills, diaphoresis, fatigue, fever and unexpected weight change.  HENT:  Negative.  Negative for hearing loss, lump/mass, mouth sores, nosebleeds, sore throat, tinnitus, trouble swallowing and voice change.   Eyes: Negative.  Negative for eye problems and icterus.  Respiratory: Negative.  Negative for chest tightness, cough, hemoptysis, shortness of breath and wheezing.   Cardiovascular: Negative.  Negative for chest pain, leg swelling and palpitations.  Gastrointestinal: Negative.  Negative for abdominal distention, abdominal pain, blood in stool, constipation, diarrhea, nausea, rectal pain and vomiting.  Endocrine: Negative.   Genitourinary: Negative.  Negative for bladder incontinence, difficulty urinating, dyspareunia, dysuria, frequency, hematuria, menstrual problem, nocturia, pelvic pain, vaginal bleeding and vaginal  discharge.   Musculoskeletal:  Positive for myalgias (BLE neuropathic pain). Negative for arthralgias, back pain, flank pain, gait problem, neck pain and neck stiffness.  Skin: Negative.  Negative for itching, rash and wound.  Neurological:  Negative for dizziness, extremity weakness, gait problem, headaches, light-headedness, numbness, seizures and speech difficulty.  Hematological: Negative.  Negative for adenopathy. Does not bruise/bleed easily.  Psychiatric/Behavioral:  Positive for sleep disturbance. Negative for confusion, decreased concentration, depression and suicidal ideas. The patient is not nervous/anxious.    VITALS:  Blood pressure 129/70, pulse 60, temperature 97.6 F (36.4 C), temperature source Oral, resp. rate 16, height 5\' 3"  (1.6 m), weight 196 lb 4.8 oz (89 kg), SpO2 99 %.     Wt Readings from Last 3 Encounters:  01/08/22 196 lb 4.8 oz (89 kg)  07/05/21 189 lb 3.2 oz (85.8 kg)  01/05/21 182 lb 4.8 oz (82.7 kg)    Body mass index is 34.77 kg/m.   Performance status (ECOG): 1 - Symptomatic but completely ambulatory   PHYSICAL EXAM:  Physical Exam Vitals and nursing note reviewed.  Constitutional:      General: She is not in acute distress.    Appearance: Normal appearance. She is normal weight. She is not ill-appearing, toxic-appearing or diaphoretic.  HENT:     Head: Normocephalic and atraumatic.  Right Ear: Tympanic membrane, ear canal and external ear normal. There is no impacted cerumen.     Left Ear: Tympanic membrane, ear canal and external ear normal. There is no impacted cerumen.     Nose: Nose normal. No congestion or rhinorrhea.     Mouth/Throat:     Mouth: Mucous membranes are moist.     Pharynx: Oropharynx is clear. No oropharyngeal exudate or posterior oropharyngeal erythema.  Eyes:     General: No scleral icterus.       Right eye: No discharge.        Left eye: No discharge.     Extraocular Movements: Extraocular movements intact.      Conjunctiva/sclera: Conjunctivae normal.     Pupils: Pupils are equal, round, and reactive to light.  Neck:     Vascular: No carotid bruit.  Cardiovascular:     Rate and Rhythm: Normal rate and regular rhythm.     Pulses: Normal pulses.     Heart sounds: Normal heart sounds. No murmur heard.    No friction rub. No gallop.  Pulmonary:     Effort: Pulmonary effort is normal. No respiratory distress.     Breath sounds: Normal breath sounds. No stridor. No wheezing, rhonchi or rales.  Chest:     Chest wall: No tenderness.     Comments: Bilateral reconstructions are negative. Abdominal:     General: Bowel sounds are normal. There is no distension.     Palpations: Abdomen is soft. There is no hepatomegaly, splenomegaly or mass.     Tenderness: There is no abdominal tenderness. There is no right CVA tenderness, left CVA tenderness, guarding or rebound.     Hernia: No hernia is present.  Musculoskeletal:        General: No swelling, tenderness, deformity or signs of injury. Normal range of motion.     Cervical back: Normal range of motion and neck supple. No rigidity or tenderness.     Right lower leg: No edema.     Left lower leg: No edema.  Lymphadenopathy:     Cervical: No cervical adenopathy.     Right cervical: No superficial, deep or posterior cervical adenopathy.    Left cervical: No superficial, deep or posterior cervical adenopathy.     Upper Body:     Right upper body: No supraclavicular, axillary or pectoral adenopathy.     Left upper body: No supraclavicular, axillary or pectoral adenopathy.  Skin:    General: Skin is warm and dry.     Coloration: Skin is not jaundiced or pale.     Findings: No bruising, erythema, lesion or rash.  Neurological:     General: No focal deficit present.     Mental Status: She is alert and oriented to person, place, and time. Mental status is at baseline.     Cranial Nerves: No cranial nerve deficit.     Sensory: No sensory deficit.     Motor:  No weakness.     Coordination: Coordination normal.     Gait: Gait normal.     Deep Tendon Reflexes: Reflexes normal.  Psychiatric:        Mood and Affect: Mood normal.        Behavior: Behavior normal.        Thought Content: Thought content normal.        Judgment: Judgment normal.    LABS:   No Labs Found.   STUDIES:     History: Past Medical History:  Diagnosis Date   Cancer [facility])    breast cancer   Constipation    GERD (gastroesophageal reflux disease)    Joint pain    SOB (shortness of breath)    Swelling of both lower extremities    Past Surgical History:  Procedure Laterality Date   ABDOMINAL HYSTERECTOMY  09/25/2018   partial this year, other partial in 2003   BREAST RECONSTRUCTION WITH PLACEMENT OF TISSUE EXPANDER AND FLEX HD (ACELLULAR HYDRATED DERMIS) Bilateral 10/23/2018   Procedure: BREAST RECONSTRUCTION WITH PLACEMENT OF TISSUE EXPANDER AND FLEX HD (ACELLULAR HYDRATED DERMIS);  Surgeon: Peggye Form, DO;  Location: [facility];  Service: Plastics;  Laterality: Bilateral;  2.5 hours   CARDIAC CATHETERIZATION     post chemo, everything normal   HYSTERECTOMY ABDOMINAL WITH SALPINGECTOMY  2021   MASTECTOMY  02/2018   MENISCUS REPAIR  2000   PORTA CATH INSERTION  02/2018   PORTA CATH REMOVAL     REMOVAL OF BILATERAL TISSUE EXPANDERS WITH PLACEMENT OF BILATERAL BREAST IMPLANTS Bilateral 01/22/2019   Procedure: REMOVAL OF BILATERAL TISSUE EXPANDERS WITH PLACEMENT OF BILATERAL BREAST IMPLANTS;  Surgeon: Peggye Form, DO;  Location: [facility];  Service: Plastics;  Laterality: Bilateral;   torn miniscus right knee  2012   Family History  Problem Relation Age of Onset   Thyroid disease Mother    Obesity Mother    Cancer Father    Social History   Substance and Sexual Activity  Drug Use No   Social History   Tobacco Use  Smoking Status Never  Smokeless Tobacco Never   Social History   Substance and Sexual  Activity  Alcohol Use Not Currently   Allergies as of 01/11/2023       Reactions   Hydromorphone Itching, Other (See Comments)   Hydromorphone Hcl Itching, Other (See Comments)        Medication List        Accurate as of December 27, 2022 12:03 PM. If you have any questions, ask your nurse or doctor.          furosemide 20 MG tablet Commonly known as: LASIX Take 1 tablet (20 mg total) by mouth daily as needed for edema.   gabapentin 100 MG capsule Commonly known as: Neurontin Take 1 capsule (100 mg total) by mouth 3 (three) times daily. Start with 1 capsule by mouth at bedtime only   tamoxifen 20 MG tablet Commonly known as: NOLVADEX TAKE 1 TABLET BY MOUTH EVERY DAY        ASSESSMENT & PLAN:   Assessment:   1. Stage IIA breast cancer which was ER/PR positive and HER2 negative, diagnosed in December 2019.  She completed adjuvant chemotherapy with Adriamycin/cyclophosphamide for 4 cycles in March of 2020.  She was placed on tamoxifen daily in June 2020, and will continue for Monroe total of 5-10 years.    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  Unfortunately, she developed Monroe breast cancer.  She has now undergone bilateral mastectomy with postoperative chemotherapy.  She has had Monroe prophylactic bilateral salpingo oophorectomy so we could consider placing her on an alterative aromatase inhibitor if she cannot tolerate the tamoxifen.     3. Idiopathic neuropathy. She complains of BLE pins/needles with worsened pain at night. We discussed trialing Gabapentin 100mg  at bedtime. She would like to hold off on trying that at this time. She plans to work on  weight reduction in the meantime as she suspects this is playing Monroe role. It is possible the Tamoxifen is aggravating this.   4. Hypocalcemia Calcium 9.1 on 11/24/2021. Advised to take supplemental Calcium with Vitamin D BID. Her bone density scan in March, 2024 was normal and we  will recheck labs in the Fall.      Plan: She knows to continue her tamoxifen daily, she continues to try to lose weight as she thinks the Tamoxifen causes her to gain.  She had Monroe bone density scan in March, 2024 that was normal.  She informed me that one of her daughters had genetic testing done and didn't have the ATM mutation, she I currently waiting on her other two to have it checked. I will see her back in 6 months with CBC and CMP. She verbalizes understanding of an agreement to the plans discussed today.  She knows to call the office should any new questions or concerns arise.     I provided 10 minutes of face-to-face time during this this encounter and > 50% was spent counseling as documented under my assessment and plan.      Dellia Beckwith, MD [facility] [address] [place] [address] Dept: [phone] Dept Fax: [phone]    Rulon Sera Lassiter,acting as Monroe scribe for Dellia Beckwith, MD.,have documented all relevant documentation on the behalf of Dellia Beckwith, MD,as directed by  Dellia Beckwith, MD while in the presence of Dellia Beckwith, MD.  I have reviewed this report as typed by the medical scribe, and it is complete and accurate.

## 2023-01-11 ENCOUNTER — Inpatient Hospital Stay: Payer: BC Managed Care – PPO | Admitting: Oncology

## 2023-02-10 ENCOUNTER — Other Ambulatory Visit: Payer: Self-pay | Admitting: Oncology

## 2023-02-10 DIAGNOSIS — Z170 Estrogen receptor positive status [ER+]: Secondary | ICD-10-CM

## 2023-02-22 ENCOUNTER — Telehealth: Payer: Self-pay | Admitting: Oncology

## 2023-02-22 ENCOUNTER — Inpatient Hospital Stay: Payer: 59 | Attending: Oncology | Admitting: Oncology

## 2023-02-22 VITALS — BP 136/77 | HR 71 | Temp 97.6°F | Resp 16 | Ht 63.0 in | Wt 180.0 lb

## 2023-02-22 DIAGNOSIS — K5900 Constipation, unspecified: Secondary | ICD-10-CM | POA: Insufficient documentation

## 2023-02-22 DIAGNOSIS — Z803 Family history of malignant neoplasm of breast: Secondary | ICD-10-CM | POA: Diagnosis not present

## 2023-02-22 DIAGNOSIS — C50911 Malignant neoplasm of unspecified site of right female breast: Secondary | ICD-10-CM | POA: Insufficient documentation

## 2023-02-22 DIAGNOSIS — Z1509 Genetic susceptibility to other malignant neoplasm: Secondary | ICD-10-CM

## 2023-02-22 DIAGNOSIS — Z9013 Acquired absence of bilateral breasts and nipples: Secondary | ICD-10-CM | POA: Diagnosis not present

## 2023-02-22 DIAGNOSIS — M2550 Pain in unspecified joint: Secondary | ICD-10-CM | POA: Diagnosis not present

## 2023-02-22 DIAGNOSIS — Z170 Estrogen receptor positive status [ER+]: Secondary | ICD-10-CM | POA: Diagnosis not present

## 2023-02-22 DIAGNOSIS — C50511 Malignant neoplasm of lower-outer quadrant of right female breast: Secondary | ICD-10-CM | POA: Diagnosis not present

## 2023-02-22 DIAGNOSIS — Z79810 Long term (current) use of selective estrogen receptor modulators (SERMs): Secondary | ICD-10-CM | POA: Diagnosis not present

## 2023-02-22 DIAGNOSIS — Z1501 Genetic susceptibility to malignant neoplasm of breast: Secondary | ICD-10-CM | POA: Diagnosis not present

## 2023-02-22 DIAGNOSIS — M549 Dorsalgia, unspecified: Secondary | ICD-10-CM | POA: Insufficient documentation

## 2023-02-22 DIAGNOSIS — K219 Gastro-esophageal reflux disease without esophagitis: Secondary | ICD-10-CM | POA: Insufficient documentation

## 2023-02-22 NOTE — Progress Notes (Signed)
 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   HISTORY OF PRESENT ILLNESS:  Tina Monroe is a 53 y.o. 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  She had a previous right breast biopsy in August 2017, which revealed a fibroadenoma.  She has a family history of breast cancer and her sister was found to have a mutation in the ATM gene, so the patient underwent genetic testing in April 2018.  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  She was offered screening with breast MRI verses bilateral mastectomy.  She agreed to breast MRI,  as well as a referral to our office.  We began seeing her in June 2018 and recommended annual mammogram alternating with MRI, every 6 month breast exam and also offered her chemoprevention with tamoxifen , which she declined at that time due to her age. As she does not have a first-degree relative with pancreatic cancer screening for pancreatic cancer in patients with an ATM mutation was not recommended.  Screening mammogram in November 2019 showed possible distortion in the right breast.  Right diagnostic mammogram in December revealed a persistent large area of distortion in the lateral right breast at middle depth.  Ultrasound of this area revealed a 2 cm irregular hypoechoic mass at 9 o'clock.  MRI breast revealed architectural distortion of the right breast with margins poorly defined, but measuring approximately 2.6 cm in the lower outer quadrant.  Biopsy revealed grade 2 invasive lobular carcinoma involving all cores, with a length of to 1.2 cm.  Estrogen  and progesterone receptors were positive and HER 2 negative.  Ki 67 was 10%.     She underwent bilateral mastectomy with right sentinel lymph node biopsy in January 2020.  Pathology revealed a 2.5 cm, grade 2, invasive lobular carcinoma, which was multifocal.  There were 2 additional foci of invasive lobular carcinoma measuring 3 mm in 2 of the sections of the subareolar breast tissue.  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  One lymph node was removed which contained isolated tumor cells.  We recommended for adjuvant chemotherapy consisting of Adriamycin/cyclophosphamide.  Echocardiogram in February revealed normal left ventricular size and function with an left ventricular ejection fraction of 60-65%.  Bone density scan in February was normal.  She completed 4 cycles of Adriamycin/cyclophosphamide in March.  Adjuvant radiation was not recommended.  Echocardiogram in April revealed normal left ventricular size and function with an ejection fraction of 60-65%.  However there was an echodensity close to the tricuspid valve of uncertain significance.  She therefore had a cardiac MRI in May, which was normal.  She was placed on tamoxifen  20 mg daily June 1st 2020.  She had her port removed in May.  She had previous hysterectomy for dysfunctional uterine bleeding.  We recommended bilateral oophorectomy to decrease her risk of recurrent or new breast cancer and she had that done in August 2020.  Dr. Lowery started her breast reconstruction process in September 2020.  She has concerns regarding a family member (cousin) who had  a double mastectomy 6 years ago, hysterectomy 3 years ago and now has recurrence of disease to her breast and liver.    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 Patient states that  she feels well but complains of dull back pain and occasional stinging sensation and soreness in the upper region of her left reconstruction. She continues Tamoxifen  20mg  without difficulty, we plan 7-10 years. She is unable to have labs done today but she will return next week for CBC and CMP and I will call her with those results. A bone density scan in March, 2024 was completely normal. i will see her back in 6 months for reevaluation.   She denies signs of infection such as sore throat, sinus drainage, cough, or urinary symptoms.  She denies fevers or recurrent chills. She denies pain. She denies nausea, vomiting, chest pain, dyspnea or cough. Her appetite is good and her weight has decreased 9 pounds over last 7 months . She has been working on her weight loss and I think she is doing well with it, losing 7 lbs last time and 9 more lbs this time.     REVIEW OF SYSTEMS:  Review of Systems  Constitutional: Negative.  Negative for appetite change, chills, diaphoresis, fatigue, fever and unexpected weight change.  HENT:  Negative.  Negative for hearing loss, lump/mass, mouth sores, nosebleeds, sore throat, tinnitus, trouble swallowing and voice change.   Eyes: Negative.  Negative for eye problems and icterus.  Respiratory: Negative.  Negative for chest tightness, cough, hemoptysis, shortness of breath and wheezing.   Cardiovascular:  Positive for chest pain (stinging and soreness in her upper left chest). Negative for leg swelling and palpitations.  Gastrointestinal: Negative.  Negative for abdominal distention, abdominal pain, blood in stool, constipation, diarrhea, nausea, rectal pain and vomiting.  Endocrine: Negative.   Genitourinary: Negative.  Negative for bladder incontinence, difficulty urinating, dyspareunia, dysuria, frequency, hematuria, menstrual problem, nocturia, pelvic pain, vaginal bleeding and vaginal discharge.   Musculoskeletal:  Positive for back pain (dull) and myalgias. Negative  for arthralgias, flank pain, gait problem, neck pain and neck stiffness.  Skin: Negative.  Negative for itching, rash and wound.  Neurological:  Negative for dizziness, extremity weakness, gait problem, headaches, light-headedness, numbness, seizures and speech difficulty.  Hematological: Negative.  Negative for adenopathy. Does not bruise/bleed easily.  Psychiatric/Behavioral:  Positive for sleep disturbance. Negative for confusion, decreased concentration, depression and suicidal ideas. The patient is not nervous/anxious.    VITALS:   Vitals:   02/22/23 1525  BP: 136/77  Pulse: 71  Resp: 16  Temp: 97.6 F (36.4 C)  SpO2: 98%    Wt Readings from Last 3 Encounters:  02/22/23 180 lb (81.6 kg)  07/10/22 189 lb 9.6 oz (86 kg)  01/08/22 196 lb 4.8 oz (89 kg)   Performance status (ECOG): 1 - Symptomatic but completely ambulatory   PHYSICAL EXAM:  Physical Exam Vitals and nursing note reviewed.  Constitutional:      General: She is not in acute distress.    Appearance: Normal appearance. She is normal weight. She is not ill-appearing, toxic-appearing or diaphoretic.  HENT:     Head: Normocephalic and atraumatic.     Right Ear: Tympanic membrane, ear canal and external ear normal. There is no impacted cerumen.     Left Ear: Tympanic membrane, ear canal and external ear normal. There is  no impacted cerumen.     Nose: Nose normal. No congestion or rhinorrhea.     Mouth/Throat:     Mouth: Mucous membranes are moist.     Pharynx: Oropharynx is clear. No oropharyngeal exudate or posterior oropharyngeal erythema.  Eyes:     General: No scleral icterus.       Right eye: No discharge.        Left eye: No discharge.     Extraocular Movements: Extraocular movements intact.     Conjunctiva/sclera: Conjunctivae normal.     Pupils: Pupils are equal, round, and reactive to light.  Neck:     Vascular: No carotid bruit.  Cardiovascular:     Rate and Rhythm: Normal rate and regular rhythm.      Pulses: Normal pulses.     Heart sounds: Normal heart sounds. No murmur heard.    No friction rub. No gallop.  Pulmonary:     Effort: Pulmonary effort is normal. No respiratory distress.     Breath sounds: Normal breath sounds. No stridor. No wheezing, rhonchi or rales.  Chest:     Chest wall: No tenderness.     Comments: Bilateral reconstructions are negative. Abdominal:     General: Bowel sounds are normal. There is no distension.     Palpations: Abdomen is soft. There is no hepatomegaly, splenomegaly or mass.     Tenderness: There is no abdominal tenderness. There is no right CVA tenderness, left CVA tenderness, guarding or rebound.     Hernia: No hernia is present.  Musculoskeletal:        General: No swelling, tenderness, deformity or signs of injury. Normal range of motion.     Cervical back: Normal range of motion and neck supple. No rigidity or tenderness.     Right lower leg: No edema.     Left lower leg: No edema.  Lymphadenopathy:     Cervical: No cervical adenopathy.     Right cervical: No superficial, deep or posterior cervical adenopathy.    Left cervical: No superficial, deep or posterior cervical adenopathy.     Upper Body:     Right upper body: No supraclavicular, axillary or pectoral adenopathy.     Left upper body: No supraclavicular, axillary or pectoral adenopathy.  Skin:    General: Skin is warm and dry.     Coloration: Skin is not jaundiced or pale.     Findings: No bruising, erythema, lesion or rash.  Neurological:     General: No focal deficit present.     Mental Status: She is alert and oriented to person, place, and time. Mental status is at baseline.     Cranial Nerves: No cranial nerve deficit.     Sensory: No sensory deficit.     Motor: No weakness.     Coordination: Coordination normal.     Gait: Gait normal.     Deep Tendon Reflexes: Reflexes normal.  Psychiatric:        Mood and Affect: Mood normal.        Behavior: Behavior normal.         Thought Content: Thought content normal.        Judgment: Judgment normal.    LABS:   Lab Results  Component Value Date   WBC 11.6 (H) 02/26/2023   HGB 13.5 02/26/2023   HCT 39.6 02/26/2023   MCV 88.4 02/26/2023   PLT 365 02/26/2023   Lab Results  Component Value Date   CREATININE 0.82 02/26/2023  BUN 11 02/26/2023   NA 140 02/26/2023   K 4.0 02/26/2023   CL 104 02/26/2023   CO2 26 02/26/2023      Component Value Date/Time   PROT 6.8 02/26/2023 1501   PROT 6.5 09/08/2019 1624   ALBUMIN 4.3 02/26/2023 1501   ALBUMIN 4.2 09/08/2019 1624   AST 18 02/26/2023 1501   ALT 18 02/26/2023 1501   ALKPHOS 107 02/26/2023 1501   BILITOT 0.3 02/26/2023 1501    STUDIES:     History: Past Medical History:  Diagnosis Date   Cancer (HCC)    breast cancer   Constipation    GERD (gastroesophageal reflux disease)    Joint pain    SOB (shortness of breath)    Swelling of both lower extremities    Past Surgical History:  Procedure Laterality Date   ABDOMINAL HYSTERECTOMY  09/25/2018   partial this year, other partial in 2003   BREAST RECONSTRUCTION WITH PLACEMENT OF TISSUE EXPANDER AND FLEX HD (ACELLULAR HYDRATED DERMIS) Bilateral 10/23/2018   Procedure: BREAST RECONSTRUCTION WITH PLACEMENT OF TISSUE EXPANDER AND FLEX HD (ACELLULAR HYDRATED DERMIS);  Surgeon: Lowery Estefana RAMAN, DO;  Location: [facility];  Service: Plastics;  Laterality: Bilateral;  2.5 hours   CARDIAC CATHETERIZATION     post chemo, everything normal   HYSTERECTOMY ABDOMINAL WITH SALPINGECTOMY  2021   MASTECTOMY  02/2018   MENISCUS REPAIR  2000   PORTA CATH INSERTION  02/2018   PORTA CATH REMOVAL     REMOVAL OF BILATERAL TISSUE EXPANDERS WITH PLACEMENT OF BILATERAL BREAST IMPLANTS Bilateral 01/22/2019   Procedure: REMOVAL OF BILATERAL TISSUE EXPANDERS WITH PLACEMENT OF BILATERAL BREAST IMPLANTS;  Surgeon: Lowery Estefana RAMAN, DO;  Location: [facility];  Service: Plastics;   Laterality: Bilateral;   torn miniscus right knee  2012   Family History  Problem Relation Age of Onset   Thyroid  disease Mother    Obesity Mother    Cancer Father    Social History   Substance and Sexual Activity  Drug Use No   Social History   Tobacco Use  Smoking Status Never  Smokeless Tobacco Never   Social History   Substance and Sexual Activity  Alcohol Use Not Currently   Allergies as of 02/22/2023       Reactions   Hydromorphone Itching, Other (See Comments)   Hydromorphone Hcl Itching, Other (See Comments)        Medication List        Accurate as of February 22, 2023 11:59 PM. If you have any questions, ask your nurse or doctor.          STOP taking these medications    furosemide  20 MG tablet Commonly known as: LASIX  Stopped by: Wanda VEAR Cornish   gabapentin  100 MG capsule Commonly known as: Neurontin  Stopped by: Wanda VEAR Cornish       TAKE these medications    tamoxifen  20 MG tablet Commonly known as: NOLVADEX  TAKE 1 TABLET BY MOUTH EVERY DAY        ASSESSMENT & PLAN:   Assessment:   1. Stage IIA breast cancer which was ER/PR positive and HER2 negative, diagnosed in December 2019.  She completed adjuvant chemotherapy with Adriamycin/cyclophosphamide for 4 cycles in March of 2020.  She was placed on tamoxifen  daily in June 2020, and will continue for a total of 7-10 years.    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  Unfortunately, she developed a breast cancer.  She has now undergone bilateral mastectomy with postoperative chemotherapy.  She has had a prophylactic bilateral salpingo oophorectomy so we could consider placing her on an alterative aromatase inhibitor if she cannot tolerate the tamoxifen .     Plan: She continues Tamoxifen  20mg  without difficulty, we plan 7-10 years. She is unable to have labs done today but she will return next week for CBC and CMP and I will  call her with those results. A bone density scan in March, 2024 was completely normal. i will see her back in 6 months for reevaluation. She verbalizes understanding of an agreement to the plans discussed today.  She knows to call the office should any new questions or concerns arise.   I provided 12 minutes of face-to-face time during this this encounter and > 50% was spent counseling as documented under my assessment and plan.    Wanda VEAR Cornish, MD [facility] [address] [place] [address] Dept: [phone] Dept Fax: [phone]    I,Jasmine M Lassiter,acting as a scribe for Wanda VEAR Cornish, MD.,have documented all relevant documentation on the behalf of Wanda VEAR Cornish, MD,as directed by  Wanda VEAR Cornish, MD while in the presence of Wanda VEAR Cornish, MD.  I have reviewed this report as typed by the medical scribe, and it is complete and accurate.

## 2023-02-22 NOTE — Telephone Encounter (Signed)
 02/22/23 Next appt scheduled and confirmed with patient

## 2023-02-26 ENCOUNTER — Inpatient Hospital Stay: Payer: 59

## 2023-02-26 DIAGNOSIS — C50911 Malignant neoplasm of unspecified site of right female breast: Secondary | ICD-10-CM | POA: Diagnosis not present

## 2023-02-26 DIAGNOSIS — C50511 Malignant neoplasm of lower-outer quadrant of right female breast: Secondary | ICD-10-CM

## 2023-02-26 DIAGNOSIS — Z1509 Genetic susceptibility to other malignant neoplasm: Secondary | ICD-10-CM

## 2023-02-26 LAB — CMP (CANCER CENTER ONLY)
ALT: 18 U/L (ref 0–44)
AST: 18 U/L (ref 15–41)
Albumin: 4.3 g/dL (ref 3.5–5.0)
Alkaline Phosphatase: 107 U/L (ref 38–126)
Anion gap: 10 (ref 5–15)
BUN: 11 mg/dL (ref 6–20)
CO2: 26 mmol/L (ref 22–32)
Calcium: 9.2 mg/dL (ref 8.9–10.3)
Chloride: 104 mmol/L (ref 98–111)
Creatinine: 0.82 mg/dL (ref 0.44–1.00)
GFR, Estimated: >60 mL/min (ref >60)
Glucose, Bld: 116 mg/dL — ABNORMAL HIGH (ref 70–99)
Potassium: 4 mmol/L (ref 3.5–5.1)
Sodium: 140 mmol/L (ref 135–145)
Total Bilirubin: 0.3 mg/dL (ref 0.0–1.2)
Total Protein: 6.8 g/dL (ref 6.5–8.1)

## 2023-02-26 LAB — CBC WITH DIFFERENTIAL (CANCER CENTER ONLY)
Abs Immature Granulocytes: 0.08 10*3/uL — ABNORMAL HIGH (ref 0.00–0.07)
Basophils Absolute: 0.1 10*3/uL (ref 0.0–0.1)
Basophils Relative: 1 %
Eosinophils Absolute: 0.2 10*3/uL (ref 0.0–0.5)
Eosinophils Relative: 2 %
HCT: 39.6 % (ref 36.0–46.0)
Hemoglobin: 13.5 g/dL (ref 12.0–15.0)
Immature Granulocytes: 1 %
Lymphocytes Relative: 20 %
Lymphs Abs: 2.3 10*3/uL (ref 0.7–4.0)
MCH: 30.1 pg (ref 26.0–34.0)
MCHC: 34.1 g/dL (ref 30.0–36.0)
MCV: 88.4 fL (ref 80.0–100.0)
Monocytes Absolute: 0.9 10*3/uL (ref 0.1–1.0)
Monocytes Relative: 8 %
Neutro Abs: 8 10*3/uL — ABNORMAL HIGH (ref 1.7–7.7)
Neutrophils Relative %: 68 %
Platelet Count: 365 10*3/uL (ref 150–400)
RBC: 4.48 MIL/uL (ref 3.87–5.11)
RDW: 12 % (ref 11.5–15.5)
WBC Count: 11.6 10*3/uL — ABNORMAL HIGH (ref 4.0–10.5)
nRBC: 0 % (ref 0.0–0.2)
nRBC: 0 /100{WBCs}

## 2023-02-27 ENCOUNTER — Encounter: Payer: Self-pay | Admitting: Oncology

## 2023-02-27 ENCOUNTER — Inpatient Hospital Stay: Payer: 59

## 2023-02-27 ENCOUNTER — Telehealth: Payer: Self-pay

## 2023-02-27 NOTE — Telephone Encounter (Signed)
 Pt has called and states she saw that her blood work, done yesterday, is marked abnormal. She wants to know what she is to do?  From what I see, her BS was 116, WBC 11.6, neut # 8.0 (normal range 1.7-7.7), abs immature granulocytes 0.08 (normal range 0.00-0.07). Please advise.

## 2023-08-30 ENCOUNTER — Inpatient Hospital Stay

## 2023-08-30 ENCOUNTER — Telehealth: Payer: Self-pay | Admitting: Hematology and Oncology

## 2023-08-30 ENCOUNTER — Inpatient Hospital Stay: Payer: 59 | Attending: Hematology and Oncology | Admitting: Hematology and Oncology

## 2023-08-30 ENCOUNTER — Other Ambulatory Visit: Payer: Self-pay | Admitting: Hematology and Oncology

## 2023-08-30 VITALS — BP 129/67 | HR 73 | Temp 98.0°F | Resp 16 | Ht 63.0 in | Wt 174.6 lb

## 2023-08-30 DIAGNOSIS — C50511 Malignant neoplasm of lower-outer quadrant of right female breast: Secondary | ICD-10-CM | POA: Diagnosis not present

## 2023-08-30 DIAGNOSIS — Z79810 Long term (current) use of selective estrogen receptor modulators (SERMs): Secondary | ICD-10-CM | POA: Diagnosis not present

## 2023-08-30 DIAGNOSIS — Z1509 Genetic susceptibility to other malignant neoplasm: Secondary | ICD-10-CM

## 2023-08-30 DIAGNOSIS — Z90710 Acquired absence of both cervix and uterus: Secondary | ICD-10-CM | POA: Diagnosis not present

## 2023-08-30 DIAGNOSIS — Z803 Family history of malignant neoplasm of breast: Secondary | ICD-10-CM | POA: Diagnosis not present

## 2023-08-30 DIAGNOSIS — Z1501 Genetic susceptibility to malignant neoplasm of breast: Secondary | ICD-10-CM | POA: Insufficient documentation

## 2023-08-30 DIAGNOSIS — Z809 Family history of malignant neoplasm, unspecified: Secondary | ICD-10-CM | POA: Insufficient documentation

## 2023-08-30 DIAGNOSIS — Z9013 Acquired absence of bilateral breasts and nipples: Secondary | ICD-10-CM | POA: Diagnosis not present

## 2023-08-30 DIAGNOSIS — K5900 Constipation, unspecified: Secondary | ICD-10-CM | POA: Diagnosis not present

## 2023-08-30 DIAGNOSIS — K219 Gastro-esophageal reflux disease without esophagitis: Secondary | ICD-10-CM | POA: Insufficient documentation

## 2023-08-30 DIAGNOSIS — Z170 Estrogen receptor positive status [ER+]: Secondary | ICD-10-CM | POA: Diagnosis not present

## 2023-08-30 DIAGNOSIS — R0602 Shortness of breath: Secondary | ICD-10-CM | POA: Diagnosis not present

## 2023-08-30 LAB — CMP (CANCER CENTER ONLY)
ALT: 14 U/L (ref 0–44)
AST: 19 U/L (ref 15–41)
Albumin: 3.8 g/dL (ref 3.5–5.0)
Alkaline Phosphatase: 99 U/L (ref 38–126)
Anion gap: 10 (ref 5–15)
BUN: 13 mg/dL (ref 6–20)
CO2: 24 mmol/L (ref 22–32)
Calcium: 9.1 mg/dL (ref 8.9–10.3)
Chloride: 106 mmol/L (ref 98–111)
Creatinine: 0.86 mg/dL (ref 0.44–1.00)
GFR, Estimated: >60 mL/min (ref >60)
Glucose, Bld: 89 mg/dL (ref 70–99)
Potassium: 3.7 mmol/L (ref 3.5–5.1)
Sodium: 140 mmol/L (ref 135–145)
Total Bilirubin: 0.5 mg/dL (ref 0.0–1.2)
Total Protein: 6.2 g/dL — ABNORMAL LOW (ref 6.5–8.1)

## 2023-08-30 LAB — T4, FREE: Free T4: 0.89 ng/dL (ref 0.61–1.12)

## 2023-08-30 LAB — CBC WITH DIFFERENTIAL (CANCER CENTER ONLY)
Abs Immature Granulocytes: 0.03 K/uL (ref 0.00–0.07)
Basophils Absolute: 0.1 K/uL (ref 0.0–0.1)
Basophils Relative: 1 %
Eosinophils Absolute: 0.2 K/uL (ref 0.0–0.5)
Eosinophils Relative: 3 %
HCT: 38.2 % (ref 36.0–46.0)
Hemoglobin: 12.6 g/dL (ref 12.0–15.0)
Immature Granulocytes: 1 %
Lymphocytes Relative: 25 %
Lymphs Abs: 1.6 K/uL (ref 0.7–4.0)
MCH: 29.9 pg (ref 26.0–34.0)
MCHC: 33 g/dL (ref 30.0–36.0)
MCV: 90.5 fL (ref 80.0–100.0)
Monocytes Absolute: 0.5 K/uL (ref 0.1–1.0)
Monocytes Relative: 8 %
Neutro Abs: 4 K/uL (ref 1.7–7.7)
Neutrophils Relative %: 62 %
Platelet Count: 288 K/uL (ref 150–400)
RBC: 4.22 MIL/uL (ref 3.87–5.11)
RDW: 12.6 % (ref 11.5–15.5)
WBC Count: 6.3 K/uL (ref 4.0–10.5)
nRBC: 0 % (ref 0.0–0.2)

## 2023-08-30 LAB — TSH: TSH: 3.12 u[IU]/mL (ref 0.350–4.500)

## 2023-08-30 NOTE — Progress Notes (Signed)
 [facility]  [address] 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   HISTORY OF PRESENT ILLNESS:  Tina Monroe is a 53 y.o. 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  She had a previous right breast biopsy in August 2017, which revealed a fibroadenoma.  She has a family history of breast cancer and her sister was found to have a mutation in the ATM gene, so the patient underwent genetic testing in April 2018.  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  She was offered screening with breast MRI verses bilateral mastectomy.  She agreed to breast MRI,  as well as a referral to our office.  We began seeing her in June 2018 and recommended annual mammogram alternating with MRI, every 6 month breast exam and also offered her chemoprevention with tamoxifen , which she declined at that time due to her age. As she does not have a first-degree relative with pancreatic cancer screening for pancreatic cancer in patients with an ATM mutation was not recommended.  Screening mammogram in November 2019 showed possible distortion in the right breast.  Right diagnostic mammogram in December revealed a persistent large area of distortion in the lateral right breast at middle depth.  Ultrasound of this area revealed a 2 cm irregular hypoechoic mass at 9 o'clock.  MRI breast revealed architectural distortion of the right breast with margins poorly defined, but measuring approximately 2.6 cm in the lower outer quadrant.  Biopsy revealed grade 2 invasive lobular carcinoma involving all cores, with a length of to 1.2 cm.  Estrogen  and progesterone receptors were positive and HER 2 negative.  Ki 67 was 10%.     She underwent bilateral mastectomy with right sentinel lymph node biopsy in January 2020.  Pathology revealed a 2.5 cm, grade 2, invasive lobular carcinoma, which was multifocal.  There were 2 additional foci of invasive lobular carcinoma measuring 3 mm in 2 of the sections of the subareolar breast tissue.  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  One lymph node was removed which contained isolated tumor cells.  We recommended for adjuvant chemotherapy consisting of Adriamycin/cyclophosphamide.  Echocardiogram in February revealed normal left ventricular size and function with an left ventricular ejection fraction of 60-65%.  Bone density scan in February was normal.  She completed 4 cycles of Adriamycin/cyclophosphamide in March.  Adjuvant radiation was not recommended.  Echocardiogram in April revealed normal left ventricular size and function with an ejection fraction of 60-65%.  However there was an echodensity close to the tricuspid valve of uncertain significance.  She therefore had a cardiac MRI in May, which was normal.  She was placed on tamoxifen  20 mg daily June 1st 2020.  She had her port removed in May.  She had previous hysterectomy for dysfunctional uterine bleeding.  We recommended bilateral oophorectomy to decrease her risk of recurrent or new breast cancer and she had that done in August 2020.  Dr. Lowery started her breast reconstruction process in September 2020.  She has concerns regarding a family member (cousin) who had  a double mastectomy 6 years ago, hysterectomy 3 years ago and now has recurrence of disease to her breast and liver.    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 Patient states that  she feels well but complains of dull back pain and occasional stinging sensation and soreness in the upper region of her left reconstruction. She continues Tamoxifen  20mg  without difficulty, we plan 7-10 years. CBC and CMP are pending from today. A bone density scan in March, 2024 was completely normal. Dr. Cornelius will see her back in 6 months for reevaluation.   She denies signs of infection such as sore throat, sinus drainage, cough, or urinary symptoms.  She denies fevers or recurrent chills. She denies pain. She denies nausea, vomiting, chest pain, dyspnea or cough. Her appetite is good and her weight has decreased 6 pounds over last 6 months. She has been working on her weight loss and I think she is doing well with it.    REVIEW OF SYSTEMS:  Review of Systems  Constitutional:  Positive for fatigue. Negative for appetite change, chills, diaphoresis, fever and unexpected weight change.  HENT:  Negative.  Negative for hearing loss, lump/mass, mouth sores, nosebleeds, sore throat, tinnitus, trouble swallowing and voice change.   Eyes: Negative.  Negative for eye problems and icterus.  Respiratory: Negative.  Negative for chest tightness, cough, hemoptysis, shortness of breath and wheezing.   Cardiovascular:  Negative for leg swelling and palpitations. Chest pain: stinging and soreness in her upper left chest. Gastrointestinal: Negative.  Negative for abdominal distention, abdominal pain, blood in stool, constipation, diarrhea, nausea, rectal pain and vomiting.  Endocrine: Negative.   Genitourinary: Negative.  Negative for bladder incontinence, difficulty urinating, dyspareunia, dysuria, frequency, hematuria, menstrual problem, nocturia, pelvic pain, vaginal bleeding and vaginal discharge.   Musculoskeletal:  Positive for back pain (dull) and myalgias. Negative for arthralgias, flank pain, gait problem, neck pain and neck stiffness.  Skin: Negative.  Negative for itching, rash and wound.   Neurological:  Negative for dizziness, extremity weakness, gait problem, headaches, light-headedness, numbness, seizures and speech difficulty.  Hematological: Negative.  Negative for adenopathy. Does not bruise/bleed easily.  Psychiatric/Behavioral:  Positive for sleep disturbance. Negative for confusion, decreased concentration, depression and suicidal ideas. The patient is not nervous/anxious.    VITALS:   There were no vitals filed for this visit.   Wt Readings from Last 3 Encounters:  02/22/23 180 lb (81.6 kg)  07/10/22 189 lb 9.6 oz (86 kg)  01/08/22 196 lb 4.8 oz (89 kg)   Performance status (ECOG): 1 - Symptomatic but completely ambulatory   PHYSICAL EXAM:  Physical Exam Vitals and nursing note reviewed.  Constitutional:      General: She is not in acute distress.    Appearance: Normal appearance. She is normal weight. She is not ill-appearing, toxic-appearing or diaphoretic.  HENT:     Head: Normocephalic and atraumatic.     Right Ear: Tympanic membrane, ear canal and external ear normal. There is no impacted cerumen.     Left Ear: Tympanic membrane, ear canal and external ear normal. There is no impacted cerumen.     Nose: Nose normal. No congestion or rhinorrhea.     Mouth/Throat:     Mouth: Mucous membranes are moist.     Pharynx: Oropharynx is clear. No oropharyngeal exudate or posterior oropharyngeal erythema.  Eyes:  General: No scleral icterus.       Right eye: No discharge.        Left eye: No discharge.     Extraocular Movements: Extraocular movements intact.     Conjunctiva/sclera: Conjunctivae normal.     Pupils: Pupils are equal, round, and reactive to light.  Neck:     Vascular: No carotid bruit.  Cardiovascular:     Rate and Rhythm: Normal rate and regular rhythm.     Pulses: Normal pulses.     Heart sounds: Normal heart sounds. No murmur heard.    No friction rub. No gallop.  Pulmonary:     Effort: Pulmonary effort is normal. No respiratory  distress.     Breath sounds: Normal breath sounds. No stridor. No wheezing, rhonchi or rales.  Chest:     Chest wall: No tenderness.     Comments: Bilateral reconstructions are negative. Abdominal:     General: Bowel sounds are normal. There is no distension.     Palpations: Abdomen is soft. There is no hepatomegaly, splenomegaly or mass.     Tenderness: There is no abdominal tenderness. There is no right CVA tenderness, left CVA tenderness, guarding or rebound.     Hernia: No hernia is present.  Musculoskeletal:        General: No swelling, tenderness, deformity or signs of injury. Normal range of motion.     Cervical back: Normal range of motion and neck supple. No rigidity or tenderness.     Right lower leg: No edema.     Left lower leg: No edema.  Lymphadenopathy:     Cervical: No cervical adenopathy.     Right cervical: No superficial, deep or posterior cervical adenopathy.    Left cervical: No superficial, deep or posterior cervical adenopathy.     Upper Body:     Right upper body: No supraclavicular, axillary or pectoral adenopathy.     Left upper body: No supraclavicular, axillary or pectoral adenopathy.  Skin:    General: Skin is warm and dry.     Coloration: Skin is not jaundiced or pale.     Findings: No bruising, erythema, lesion or rash.  Neurological:     General: No focal deficit present.     Mental Status: She is alert and oriented to person, place, and time. Mental status is at baseline.     Cranial Nerves: No cranial nerve deficit.     Sensory: No sensory deficit.     Motor: No weakness.     Coordination: Coordination normal.     Gait: Gait normal.     Deep Tendon Reflexes: Reflexes normal.  Psychiatric:        Mood and Affect: Mood normal.        Behavior: Behavior normal.        Thought Content: Thought content normal.        Judgment: Judgment normal.    LABS:   Lab Results  Component Value Date   WBC 11.6 (H) 02/26/2023   HGB 13.5 02/26/2023   HCT  39.6 02/26/2023   MCV 88.4 02/26/2023   PLT 365 02/26/2023   Lab Results  Component Value Date   CREATININE 0.82 02/26/2023   BUN 11 02/26/2023   NA 140 02/26/2023   K 4.0 02/26/2023   CL 104 02/26/2023   CO2 26 02/26/2023      Component Value Date/Time   PROT 6.8 02/26/2023 1501   PROT 6.5 09/08/2019 1624   ALBUMIN 4.3 02/26/2023  1501   ALBUMIN 4.2 09/08/2019 1624   AST 18 02/26/2023 1501   ALT 18 02/26/2023 1501   ALKPHOS 107 02/26/2023 1501   BILITOT 0.3 02/26/2023 1501    STUDIES:     History: Past Medical History:  Diagnosis Date  . Cancer [facility])    breast cancer  . Constipation   . GERD (gastroesophageal reflux disease)   . Joint pain   . SOB (shortness of breath)   . Swelling of both lower extremities    Past Surgical History:  Procedure Laterality Date  . ABDOMINAL HYSTERECTOMY  09/25/2018   partial this year, other partial in 2003  . BREAST RECONSTRUCTION WITH PLACEMENT OF TISSUE EXPANDER AND FLEX HD (ACELLULAR HYDRATED DERMIS) Bilateral 10/23/2018   Procedure: BREAST RECONSTRUCTION WITH PLACEMENT OF TISSUE EXPANDER AND FLEX HD (ACELLULAR HYDRATED DERMIS);  Surgeon: Lowery Estefana RAMAN, DO;  Location: [facility];  Service: Plastics;  Laterality: Bilateral;  2.5 hours  . CARDIAC CATHETERIZATION     post chemo, everything normal  . HYSTERECTOMY ABDOMINAL WITH SALPINGECTOMY  2021  . MASTECTOMY  02/2018  . MENISCUS REPAIR  2000  . PORTA CATH INSERTION  02/2018  . PORTA CATH REMOVAL    . REMOVAL OF BILATERAL TISSUE EXPANDERS WITH PLACEMENT OF BILATERAL BREAST IMPLANTS Bilateral 01/22/2019   Procedure: REMOVAL OF BILATERAL TISSUE EXPANDERS WITH PLACEMENT OF BILATERAL BREAST IMPLANTS;  Surgeon: Lowery Estefana RAMAN, DO;  Location: [facility];  Service: Plastics;  Laterality: Bilateral;  . torn miniscus right knee  2012   Family History  Problem Relation Age of Onset  . Thyroid disease Mother   . Obesity Mother   . Cancer Father     Social History   Substance and Sexual Activity  Drug Use No   Social History   Tobacco Use  Smoking Status Never  Smokeless Tobacco Never   Social History   Substance and Sexual Activity  Alcohol Use Not Currently   Allergies as of 08/30/2023       Reactions   Hydromorphone Itching, Other (See Comments)   Hydromorphone Hcl Itching, Other (See Comments)        Medication List        Accurate as of August 30, 2023  9:06 AM. If you have any questions, ask your nurse or doctor.          tamoxifen  20 MG tablet Commonly known as: NOLVADEX  TAKE 1 TABLET BY MOUTH EVERY DAY        ASSESSMENT & PLAN:   Assessment:   1. Stage IIA breast cancer which was ER/PR positive and HER2 negative, diagnosed in December 2019.  She completed adjuvant chemotherapy with Adriamycin/cyclophosphamide for 4 cycles in March of 2020.  She was placed on tamoxifen  daily in June 2020, and will continue for a total of 7-10 years.    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  Unfortunately, she developed a breast cancer.  She has now undergone bilateral mastectomy with postoperative chemotherapy.  She has had a prophylactic bilateral salpingo oophorectomy so we could consider placing her on an alterative aromatase inhibitor if she cannot tolerate the tamoxifen .     Plan: She continues Tamoxifen  20mg  without difficulty, we plan 7-10 years. She is unable to have labs done today but she will return next week for CBC and CMP and I will call her with those results. A bone density scan in March, 2024 was completely  normal. Dr. Cornelius will see her back in 6 months for reevaluation. She verbalizes understanding of an agreement to the plans discussed today.  She knows to call the office should any new questions or concerns arise.   I provided 15 minutes of face-to-face time during this this encounter and > 50% was spent counseling as documented under my  assessment and plan.    Eleanor Bach, FNP- BC [facility] [address] [place] [address] Dept: [phone] Dept Fax: [phone]

## 2023-08-30 NOTE — Progress Notes (Deleted)
 [facility]  [address] 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   HISTORY OF PRESENT ILLNESS:  Tina Monroe is a 53 y.o. 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  She had a previous right breast biopsy in August 2017, which revealed a fibroadenoma.  She has a family history of breast cancer and her sister was found to have a mutation in the ATM gene, so the patient underwent genetic testing in April 2018.  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  She was offered screening with breast MRI verses bilateral mastectomy.  She agreed to breast MRI,  as well as a referral to our office.  We began seeing her in June 2018 and recommended annual mammogram alternating with MRI, every 6 month breast exam and also offered her chemoprevention with tamoxifen , which she declined at that time due to her age. As she does not have a first-degree relative with pancreatic cancer screening for pancreatic cancer in patients with an ATM mutation was not recommended.  Screening mammogram in November 2019 showed possible distortion in the right breast.  Right diagnostic mammogram in December revealed a persistent large area of distortion in the lateral right breast at middle depth.  Ultrasound of this area revealed a 2 cm irregular hypoechoic mass at 9 o'clock.  MRI breast revealed architectural distortion of the right breast with margins poorly defined, but measuring approximately 2.6 cm in the lower outer quadrant.  Biopsy revealed grade 2 invasive lobular carcinoma involving all cores, with a length of to 1.2 cm.  Estrogen  and progesterone receptors were positive and HER 2 negative.  Ki 67 was 10%.     She underwent bilateral mastectomy with right sentinel lymph node biopsy in January 2020.  Pathology revealed a 2.5 cm, grade 2, invasive lobular carcinoma, which was multifocal.  There were 2 additional foci of invasive lobular carcinoma measuring 3 mm in 2 of the sections of the subareolar breast tissue.  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  One lymph node was removed which contained isolated tumor cells.  We recommended for adjuvant chemotherapy consisting of Adriamycin/cyclophosphamide.  Echocardiogram in February revealed normal left ventricular size and function with an left ventricular ejection fraction of 60-65%.  Bone density scan in February was normal.  She completed 4 cycles of Adriamycin/cyclophosphamide in March.  Adjuvant radiation was not recommended.  Echocardiogram in April revealed normal left ventricular size and function with an ejection fraction of 60-65%.  However there was an echodensity close to the tricuspid valve of uncertain significance.  She therefore had a cardiac MRI in May, which was normal.  She was placed on tamoxifen  20 mg daily June 1st 2020.  She had her port removed in May.  She had previous hysterectomy for dysfunctional uterine bleeding.  We recommended bilateral oophorectomy to decrease her risk of recurrent or new breast cancer and she had that done in August 2020.  Dr. Lowery started her breast reconstruction process in September 2020.  She has concerns regarding a family member (cousin) who had  a double mastectomy 6 years ago, hysterectomy 3 years ago and now has recurrence of disease to her breast and liver.    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 Patient states that  she feels well but complains of dull back pain and occasional stinging sensation and soreness in the upper region of her left reconstruction. She continues Tamoxifen  20mg  without difficulty, we plan 7-10 years. She is unable to have labs done today but she will return next week for CBC and CMP and I will call her with those results. A bone density scan in March, 2024 was completely normal. i will see her back in 6 months for reevaluation.   She denies signs of infection such as sore throat, sinus drainage, cough, or urinary symptoms.  She denies fevers or recurrent chills. She denies pain. She denies nausea, vomiting, chest pain, dyspnea or cough. Her appetite is good and her weight has decreased 9 pounds over last 7 months. She has been working on her weight loss and I think she is doing well with it, losing 7 lbs last time and 9 more lbs this time.     REVIEW OF SYSTEMS:  Review of Systems  Constitutional: Negative.  Negative for appetite change, chills, diaphoresis, fatigue, fever and unexpected weight change.  HENT:  Negative.  Negative for hearing loss, lump/mass, mouth sores, nosebleeds, sore throat, tinnitus, trouble swallowing and voice change.   Eyes: Negative.  Negative for eye problems and icterus.  Respiratory: Negative.  Negative for chest tightness, cough, hemoptysis, shortness of breath and wheezing.   Cardiovascular:  Positive for chest pain (stinging and soreness in her upper left chest). Negative for leg swelling and palpitations.  Gastrointestinal: Negative.  Negative for abdominal distention, abdominal pain, blood in stool, constipation, diarrhea, nausea, rectal pain and vomiting.  Endocrine: Negative.   Genitourinary: Negative.  Negative for bladder incontinence, difficulty urinating, dyspareunia, dysuria, frequency, hematuria, menstrual problem, nocturia, pelvic pain, vaginal bleeding and vaginal discharge.   Musculoskeletal:  Positive for back pain (dull) and myalgias. Negative  for arthralgias, flank pain, gait problem, neck pain and neck stiffness.  Skin: Negative.  Negative for itching, rash and wound.  Neurological:  Negative for dizziness, extremity weakness, gait problem, headaches, light-headedness, numbness, seizures and speech difficulty.  Hematological: Negative.  Negative for adenopathy. Does not bruise/bleed easily.  Psychiatric/Behavioral:  Positive for sleep disturbance. Negative for confusion, decreased concentration, depression and suicidal ideas. The patient is not nervous/anxious.    VITALS:   There were no vitals filed for this visit.   Wt Readings from Last 3 Encounters:  02/22/23 180 lb (81.6 kg)  07/10/22 189 lb 9.6 oz (86 kg)  01/08/22 196 lb 4.8 oz (89 kg)   Performance status (ECOG): 1 - Symptomatic but completely ambulatory   PHYSICAL EXAM:  Physical Exam Vitals and nursing note reviewed.  Constitutional:      General: She is not in acute distress.    Appearance: Normal appearance. She is normal weight. She is not ill-appearing, toxic-appearing or diaphoretic.  HENT:     Head: Normocephalic and atraumatic.     Right Ear: Tympanic membrane, ear canal and external ear normal. There is no impacted cerumen.     Left Ear: Tympanic membrane, ear canal and external ear normal. There is no impacted cerumen.     Nose: Nose normal. No congestion or rhinorrhea.  Mouth/Throat:     Mouth: Mucous membranes are moist.     Pharynx: Oropharynx is clear. No oropharyngeal exudate or posterior oropharyngeal erythema.  Eyes:     General: No scleral icterus.       Right eye: No discharge.        Left eye: No discharge.     Extraocular Movements: Extraocular movements intact.     Conjunctiva/sclera: Conjunctivae normal.     Pupils: Pupils are equal, round, and reactive to light.  Neck:     Vascular: No carotid bruit.  Cardiovascular:     Rate and Rhythm: Normal rate and regular rhythm.     Pulses: Normal pulses.     Heart sounds: Normal heart  sounds. No murmur heard.    No friction rub. No gallop.  Pulmonary:     Effort: Pulmonary effort is normal. No respiratory distress.     Breath sounds: Normal breath sounds. No stridor. No wheezing, rhonchi or rales.  Chest:     Chest wall: No tenderness.     Comments: Bilateral reconstructions are negative. Abdominal:     General: Bowel sounds are normal. There is no distension.     Palpations: Abdomen is soft. There is no hepatomegaly, splenomegaly or mass.     Tenderness: There is no abdominal tenderness. There is no right CVA tenderness, left CVA tenderness, guarding or rebound.     Hernia: No hernia is present.  Musculoskeletal:        General: No swelling, tenderness, deformity or signs of injury. Normal range of motion.     Cervical back: Normal range of motion and neck supple. No rigidity or tenderness.     Right lower leg: No edema.     Left lower leg: No edema.  Lymphadenopathy:     Cervical: No cervical adenopathy.     Right cervical: No superficial, deep or posterior cervical adenopathy.    Left cervical: No superficial, deep or posterior cervical adenopathy.     Upper Body:     Right upper body: No supraclavicular, axillary or pectoral adenopathy.     Left upper body: No supraclavicular, axillary or pectoral adenopathy.  Skin:    General: Skin is warm and dry.     Coloration: Skin is not jaundiced or pale.     Findings: No bruising, erythema, lesion or rash.  Neurological:     General: No focal deficit present.     Mental Status: She is alert and oriented to person, place, and time. Mental status is at baseline.     Cranial Nerves: No cranial nerve deficit.     Sensory: No sensory deficit.     Motor: No weakness.     Coordination: Coordination normal.     Gait: Gait normal.     Deep Tendon Reflexes: Reflexes normal.  Psychiatric:        Mood and Affect: Mood normal.        Behavior: Behavior normal.        Thought Content: Thought content normal.         Judgment: Judgment normal.    LABS:   Lab Results  Component Value Date   WBC 11.6 (H) 02/26/2023   HGB 13.5 02/26/2023   HCT 39.6 02/26/2023   MCV 88.4 02/26/2023   PLT 365 02/26/2023   Lab Results  Component Value Date   CREATININE 0.82 02/26/2023   BUN 11 02/26/2023   NA 140 02/26/2023   K 4.0 02/26/2023   CL 104  02/26/2023   CO2 26 02/26/2023      Component Value Date/Time   PROT 6.8 02/26/2023 1501   PROT 6.5 09/08/2019 1624   ALBUMIN 4.3 02/26/2023 1501   ALBUMIN 4.2 09/08/2019 1624   AST 18 02/26/2023 1501   ALT 18 02/26/2023 1501   ALKPHOS 107 02/26/2023 1501   BILITOT 0.3 02/26/2023 1501    STUDIES:     History: Past Medical History:  Diagnosis Date   Cancer (HCC)    breast cancer   Constipation    GERD (gastroesophageal reflux disease)    Joint pain    SOB (shortness of breath)    Swelling of both lower extremities    Past Surgical History:  Procedure Laterality Date   ABDOMINAL HYSTERECTOMY  09/25/2018   partial this year, other partial in 2003   BREAST RECONSTRUCTION WITH PLACEMENT OF TISSUE EXPANDER AND FLEX HD (ACELLULAR HYDRATED DERMIS) Bilateral 10/23/2018   Procedure: BREAST RECONSTRUCTION WITH PLACEMENT OF TISSUE EXPANDER AND FLEX HD (ACELLULAR HYDRATED DERMIS);  Surgeon: Lowery Estefana RAMAN, DO;  Location: [facility];  Service: Plastics;  Laterality: Bilateral;  2.5 hours   CARDIAC CATHETERIZATION     post chemo, everything normal   HYSTERECTOMY ABDOMINAL WITH SALPINGECTOMY  2021   MASTECTOMY  02/2018   MENISCUS REPAIR  2000   PORTA CATH INSERTION  02/2018   PORTA CATH REMOVAL     REMOVAL OF BILATERAL TISSUE EXPANDERS WITH PLACEMENT OF BILATERAL BREAST IMPLANTS Bilateral 01/22/2019   Procedure: REMOVAL OF BILATERAL TISSUE EXPANDERS WITH PLACEMENT OF BILATERAL BREAST IMPLANTS;  Surgeon: Lowery Estefana RAMAN, DO;  Location: [facility];  Service: Plastics;  Laterality: Bilateral;   torn miniscus right knee  2012    Family History  Problem Relation Age of Onset   Thyroid disease Mother    Obesity Mother    Cancer Father    Social History   Substance and Sexual Activity  Drug Use No   Social History   Tobacco Use  Smoking Status Never  Smokeless Tobacco Never   Social History   Substance and Sexual Activity  Alcohol Use Not Currently   Allergies as of 08/30/2023       Reactions   Hydromorphone Itching, Other (See Comments)   Hydromorphone Hcl Itching, Other (See Comments)        Medication List        Accurate as of August 30, 2023  9:04 AM. If you have any questions, ask your nurse or doctor.          tamoxifen  20 MG tablet Commonly known as: NOLVADEX  TAKE 1 TABLET BY MOUTH EVERY DAY        ASSESSMENT & PLAN:   Assessment:   1. Stage IIA breast cancer which was ER/PR positive and HER2 negative, diagnosed in December 2019.  She completed adjuvant chemotherapy with Adriamycin/cyclophosphamide for 4 cycles in March of 2020.  She was placed on tamoxifen  daily in June 2020, and will continue for a total of 7-10 years.    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  Unfortunately, she developed a breast cancer.  She has now undergone bilateral mastectomy with postoperative chemotherapy.  She has had a prophylactic bilateral salpingo oophorectomy so we could consider placing her on an alterative aromatase inhibitor if she cannot tolerate the tamoxifen .     Plan: She continues Tamoxifen  20mg  without difficulty, we plan 7-10 years. She is unable to  have labs done today but she will return next week for CBC and CMP and I will call her with those results. A bone density scan in March, 2024 was completely normal. i will see her back in 6 months for reevaluation. She verbalizes understanding of an agreement to the plans discussed today.  She knows to call the office should any new questions or concerns arise.   I provided 12 minutes  of face-to-face time during this this encounter and > 50% was spent counseling as documented under my assessment and plan.    Tina VEAR Cornish, MD [facility] [address] [place] [address] Dept: [phone] Dept Fax: [phone]    I,Jasmine M Lassiter,acting as a scribe for Eleanor DELENA Bach, NP.,have documented all relevant documentation on the behalf of Eleanor DELENA Bach, NP,as directed by  Eleanor DELENA Bach, NP while in the presence of Eleanor DELENA Bach, NP.  I have reviewed this report as typed by the medical scribe, and it is complete and accurate.

## 2023-08-30 NOTE — Telephone Encounter (Signed)
 Patient has been scheduled for follow-up visit per 08/30/23 LOS.  Pt given an appt calendar with date and time.

## 2024-03-03 ENCOUNTER — Ambulatory Visit: Admitting: Oncology

## 2024-03-03 ENCOUNTER — Other Ambulatory Visit

## 2024-03-13 ENCOUNTER — Inpatient Hospital Stay: Admitting: Oncology

## 2024-03-13 ENCOUNTER — Inpatient Hospital Stay

## 2024-03-19 ENCOUNTER — Encounter: Payer: Self-pay | Admitting: Oncology

## 2024-03-19 ENCOUNTER — Other Ambulatory Visit: Payer: Self-pay | Admitting: Oncology

## 2024-03-19 ENCOUNTER — Inpatient Hospital Stay: Admitting: Oncology

## 2024-03-19 ENCOUNTER — Inpatient Hospital Stay: Attending: Oncology

## 2024-03-19 VITALS — BP 123/65 | HR 78 | Temp 97.8°F | Resp 16 | Ht 63.0 in | Wt 178.6 lb

## 2024-03-19 DIAGNOSIS — Z170 Estrogen receptor positive status [ER+]: Secondary | ICD-10-CM | POA: Diagnosis not present

## 2024-03-19 DIAGNOSIS — C50919 Malignant neoplasm of unspecified site of unspecified female breast: Secondary | ICD-10-CM

## 2024-03-19 DIAGNOSIS — Z1509 Genetic susceptibility to other malignant neoplasm: Secondary | ICD-10-CM | POA: Diagnosis not present

## 2024-03-19 DIAGNOSIS — Z9189 Other specified personal risk factors, not elsewhere classified: Secondary | ICD-10-CM

## 2024-03-19 DIAGNOSIS — C50511 Malignant neoplasm of lower-outer quadrant of right female breast: Secondary | ICD-10-CM | POA: Diagnosis not present

## 2024-03-19 DIAGNOSIS — Z1501 Genetic susceptibility to malignant neoplasm of breast: Secondary | ICD-10-CM

## 2024-03-19 LAB — CBC WITH DIFFERENTIAL (CANCER CENTER ONLY)
Abs Immature Granulocytes: 0.05 10*3/uL (ref 0.00–0.07)
Basophils Absolute: 0.1 10*3/uL (ref 0.0–0.1)
Basophils Relative: 1 %
Eosinophils Absolute: 0.2 10*3/uL (ref 0.0–0.5)
Eosinophils Relative: 2 %
HCT: 38.7 % (ref 36.0–46.0)
Hemoglobin: 13.1 g/dL (ref 12.0–15.0)
Immature Granulocytes: 1 %
Lymphocytes Relative: 22 %
Lymphs Abs: 1.8 10*3/uL (ref 0.7–4.0)
MCH: 29.5 pg (ref 26.0–34.0)
MCHC: 33.9 g/dL (ref 30.0–36.0)
MCV: 87.2 fL (ref 80.0–100.0)
Monocytes Absolute: 0.7 10*3/uL (ref 0.1–1.0)
Monocytes Relative: 9 %
Neutro Abs: 5.2 10*3/uL (ref 1.7–7.7)
Neutrophils Relative %: 65 %
Platelet Count: 295 10*3/uL (ref 150–400)
RBC: 4.44 MIL/uL (ref 3.87–5.11)
RDW: 12.7 % (ref 11.5–15.5)
WBC Count: 7.9 10*3/uL (ref 4.0–10.5)
nRBC: 0 % (ref 0.0–0.2)

## 2024-03-19 LAB — CMP (CANCER CENTER ONLY)
ALT: 15 U/L (ref 0–44)
AST: 16 U/L (ref 15–41)
Albumin: 4.1 g/dL (ref 3.5–5.0)
Alkaline Phosphatase: 74 U/L (ref 38–126)
Anion gap: 9 (ref 5–15)
BUN: 12 mg/dL (ref 6–20)
CO2: 27 mmol/L (ref 22–32)
Calcium: 9.2 mg/dL (ref 8.9–10.3)
Chloride: 105 mmol/L (ref 98–111)
Creatinine: 0.83 mg/dL (ref 0.44–1.00)
GFR, Estimated: >60 mL/min
Glucose, Bld: 101 mg/dL — ABNORMAL HIGH (ref 70–99)
Potassium: 4.4 mmol/L (ref 3.5–5.1)
Sodium: 141 mmol/L (ref 135–145)
Total Bilirubin: 0.5 mg/dL (ref 0.0–1.2)
Total Protein: 6.6 g/dL (ref 6.5–8.1)

## 2024-03-19 LAB — TSH: TSH: 3.17 u[IU]/mL (ref 0.350–4.500)

## 2024-03-19 NOTE — Progress Notes (Signed)
 " [facility]  [address] 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   HISTORY OF PRESENT ILLNESS:  Tina Monroe is a 54 y.o. 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  She had a previous right breast biopsy in August 2017, which revealed a fibroadenoma.  She has a family history of breast cancer and her sister was found to have a mutation in the ATM gene, so the patient underwent genetic testing in April 2018.  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  She was offered screening with breast MRI verses bilateral mastectomy.  She agreed to breast MRI,  as well as a referral to our office.  We began seeing her in June 2018 and recommended annual mammogram alternating with MRI, every 6 month breast exam and also offered her chemoprevention with tamoxifen , which she declined at that time due to her age. As she does not have a first-degree relative with pancreatic cancer screening for pancreatic cancer in patients with an ATM mutation was not recommended.  Screening mammogram in November 2019 showed possible distortion in the right breast.  Right diagnostic mammogram in December revealed a persistent large area of distortion in the lateral right breast at middle depth.  Ultrasound of this area revealed a 2 cm irregular hypoechoic mass at 9 o'clock.  MRI breast revealed architectural distortion of the right breast with margins poorly defined, but measuring approximately 2.6 cm in the lower outer quadrant.  Biopsy revealed grade 2 invasive lobular carcinoma involving all cores, with a length of to 1.2 cm.  Estrogen and progesterone  receptors were positive and HER 2 negative.  Ki 67 was 10%.     She underwent bilateral mastectomy with right sentinel lymph node biopsy in January 2020.  Pathology revealed a 2.5 cm, grade 2, invasive lobular carcinoma, which was multifocal.  There were 2 additional foci of invasive lobular carcinoma measuring 3 mm in 2 of the sections of the subareolar breast tissue.  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  One lymph node was removed which contained isolated tumor cells.  We recommended for adjuvant chemotherapy consisting of Adriamycin/cyclophosphamide.  Echocardiogram in February revealed normal left ventricular size and function with an left ventricular ejection fraction of 60-65%.  Bone density scan in February was normal.  She completed 4 cycles of Adriamycin/cyclophosphamide in March.  Adjuvant radiation was not recommended.  Echocardiogram in April revealed normal left ventricular size and function with an ejection fraction of 60-65%.  However there was an echodensity close to the tricuspid valve of uncertain significance.  She therefore had a cardiac MRI in May, which was normal.  She was placed on tamoxifen  20 mg daily June 1st 2020.  She had her port removed in May.  She had previous hysterectomy for dysfunctional uterine bleeding.  We recommended bilateral oophorectomy to decrease her risk of recurrent or new breast cancer and she had that done in August 2020.  Dr. Lowery started her breast reconstruction process in September 2020.  She has concerns regarding a family member (cousin) who had  a double mastectomy 6 years ago, hysterectomy 3 years ago and now has recurrence of disease to her breast and liver.    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 Patient states that she feels well  and has no complaints of pain. She continues Tamoxifen  20 mg daily without difficulty. Her last bone density scan was done in March, 2024 so she will be due for repeat at the beginning of March, and I will get that scheduled. She has a WBC of 7.9, hemoglobin of 13.1, and platelet count of 295,000. Her CMP is completely normal and her TSH level is pending. I will call her with the results. Her PCP also does routine blood work. I will see her back in 6 months for reevaluation. She denies fever, chills, night sweats, or other signs of infection. She denies cardiorespiratory and gastrointestinal issues. She  denies pain. Her appetite is good and Her weight has increased 4 pounds over last 6 months.  REVIEW OF SYSTEMS:  Review of Systems  Constitutional:  Positive for fatigue. Negative for appetite change, chills, diaphoresis, fever and unexpected weight change.  HENT:  Negative.  Negative for hearing loss, lump/mass, mouth sores, nosebleeds, sore throat, tinnitus, trouble swallowing and voice change.   Eyes: Negative.  Negative for eye problems and icterus.  Respiratory: Negative.  Negative for chest tightness, cough, hemoptysis, shortness of breath and wheezing.   Cardiovascular:  Negative for chest pain, leg swelling and palpitations.  Gastrointestinal: Negative.  Negative for abdominal distention, abdominal pain, blood in stool, constipation, diarrhea, nausea, rectal pain and vomiting.  Endocrine: Negative.   Genitourinary: Negative.  Negative for bladder incontinence, difficulty urinating, dyspareunia, dysuria, frequency, hematuria, menstrual problem, nocturia, pelvic pain, vaginal bleeding and vaginal discharge.   Musculoskeletal:  Positive for back pain (dull) and myalgias. Negative for arthralgias, flank pain, gait problem, neck pain and neck stiffness.  Skin: Negative.  Negative for itching, rash and wound.  Neurological:  Negative for dizziness, extremity weakness, gait problem, headaches,  light-headedness, numbness, seizures and speech difficulty.  Hematological: Negative.  Negative for adenopathy. Does not bruise/bleed easily.  Psychiatric/Behavioral:  Positive for sleep disturbance. Negative for confusion, decreased concentration, depression and suicidal ideas. The patient is not nervous/anxious.    VITALS:   Vitals:   03/19/24 1541  BP: 123/65  Pulse: 78  Resp: 16  Temp: 97.8 F (36.6 C)  SpO2: 100%   Wt Readings from Last 3 Encounters:  03/20/24 180 lb (81.6 kg)  03/19/24 178 lb 9.6 oz (81 kg)  08/30/23 174 lb 9.6 oz (79.2 kg)    Performance status (ECOG): 0 - Asymptomatic   PHYSICAL EXAM:  Physical Exam Vitals and nursing note reviewed.  Constitutional:      General: She is not in acute distress.    Appearance: Normal appearance. She is normal weight. She is not ill-appearing, toxic-appearing or diaphoretic.  HENT:     Head: Normocephalic and atraumatic.     Right Ear: Tympanic membrane, ear canal and external ear normal. There is no impacted cerumen.     Left Ear: Tympanic membrane, ear canal and external ear normal. There is no impacted cerumen.     Nose: Nose normal. No congestion or rhinorrhea.     Mouth/Throat:     Mouth: Mucous membranes are moist.     Pharynx: Oropharynx is clear. No oropharyngeal exudate or posterior oropharyngeal erythema.  Eyes:     General: No scleral icterus.       Right eye: No discharge.        Left eye: No discharge.     Extraocular Movements: Extraocular movements intact.     Conjunctiva/sclera: Conjunctivae normal.     Pupils: Pupils are equal, round, and reactive to light.  Neck:     Vascular: No carotid bruit.  Cardiovascular:     Rate and Rhythm: Normal rate and regular rhythm.     Pulses: Normal pulses.     Heart sounds: Normal heart sounds. No murmur heard.    No friction rub. No gallop.  Pulmonary:     Effort: Pulmonary effort is normal. No respiratory distress.     Breath sounds: Normal breath sounds.  No stridor. No wheezing, rhonchi or rales.  Chest:     Chest wall: No tenderness.     Comments: Bilateral reconstructions are negative. Abdominal:     General: Bowel sounds are normal. There is no distension.     Palpations: Abdomen is soft. There is no hepatomegaly, splenomegaly or mass.     Tenderness: There is no abdominal tenderness. There is no right CVA tenderness, left CVA tenderness, guarding or rebound.     Hernia: No hernia is present.  Musculoskeletal:        General: No swelling, tenderness, deformity or signs of injury. Normal range of motion.     Cervical back: Normal range of motion and neck supple. No rigidity or tenderness.     Right lower leg: No edema.     Left lower leg: No edema.  Lymphadenopathy:     Cervical: No cervical adenopathy.     Right cervical: No superficial, deep or posterior cervical adenopathy.    Left cervical: No superficial, deep or posterior cervical adenopathy.     Upper Body:     Right upper body: No supraclavicular, axillary or pectoral adenopathy.     Left upper body: No supraclavicular, axillary or pectoral adenopathy.  Skin:    General: Skin is warm and dry.     Coloration: Skin is not jaundiced or pale.     Findings: No bruising, erythema, lesion or rash.  Neurological:     General: No focal deficit present.     Mental Status: She is alert and oriented to person, place, and time. Mental status is at baseline.     Cranial Nerves: No cranial nerve deficit.     Sensory: No sensory deficit.     Motor: No weakness.     Coordination: Coordination normal.     Gait: Gait normal.     Deep Tendon Reflexes: Reflexes normal.  Psychiatric:        Mood and Affect: Mood normal.        Behavior: Behavior normal.        Thought Content: Thought content normal.        Judgment: Judgment normal.    LABS:   Lab Results  Component Value Date   WBC 6.9 03/20/2024   HGB 13.0 03/20/2024   HCT 38.1 03/20/2024   MCV 88.0 03/20/2024   PLT 240  03/20/2024   Lab Results  Component Value Date   CREATININE 0.76 03/20/2024   BUN 11 03/20/2024   NA 138 03/20/2024   K 3.8 03/20/2024   CL 107 03/20/2024   CO2 22 03/20/2024      Component Value Date/Time   PROT 6.2 (L) 03/20/2024 1151   PROT 6.5 09/08/2019 1624  ALBUMIN 3.8 03/20/2024 1151   ALBUMIN 4.2 09/08/2019 1624   AST 16 03/20/2024 1151   AST 16 03/19/2024 1520   ALT 12 03/20/2024 1151   ALT 15 03/19/2024 1520   ALKPHOS 72 03/20/2024 1151   BILITOT 0.6 03/20/2024 1151   BILITOT 0.5 03/19/2024 1520    STUDIES:     History: Past Medical History:  Diagnosis Date   Cancer (HCC)    breast cancer   Constipation    GERD (gastroesophageal reflux disease)    Joint pain    SOB (shortness of breath)    Swelling of both lower extremities    Past Surgical History:  Procedure Laterality Date   ABDOMINAL HYSTERECTOMY  09/25/2018   partial this year, other partial in 2003   BREAST RECONSTRUCTION WITH PLACEMENT OF TISSUE EXPANDER AND FLEX HD (ACELLULAR HYDRATED DERMIS) Bilateral 10/23/2018   Procedure: BREAST RECONSTRUCTION WITH PLACEMENT OF TISSUE EXPANDER AND FLEX HD (ACELLULAR HYDRATED DERMIS);  Surgeon: Lowery Estefana RAMAN, DO;  Location: [facility];  Service: Plastics;  Laterality: Bilateral;  2.5 hours   CARDIAC CATHETERIZATION     post chemo, everything normal   HYSTERECTOMY ABDOMINAL WITH SALPINGECTOMY  2021   MASTECTOMY  02/2018   MENISCUS REPAIR  2000   PORTA CATH INSERTION  02/2018   PORTA CATH REMOVAL     REMOVAL OF BILATERAL TISSUE EXPANDERS WITH PLACEMENT OF BILATERAL BREAST IMPLANTS Bilateral 01/22/2019   Procedure: REMOVAL OF BILATERAL TISSUE EXPANDERS WITH PLACEMENT OF BILATERAL BREAST IMPLANTS;  Surgeon: Lowery Estefana RAMAN, DO;  Location: [facility];  Service: Plastics;  Laterality: Bilateral;   torn miniscus right knee  2012   Family History  Problem Relation Age of Onset   Thyroid  disease Mother    Obesity Mother     Cancer Father    Social History   Substance and Sexual Activity  Drug Use No   Social History   Tobacco Use  Smoking Status Never  Smokeless Tobacco Never   Social History   Substance and Sexual Activity  Alcohol Use Not Currently   Allergies as of 03/19/2024       Reactions   Hydromorphone Itching, Other (See Comments)   Hydromorphone Hcl Itching, Other (See Comments)        Medication List        Accurate as of March 19, 2024 11:59 PM. If you have any questions, ask your nurse or doctor.          tamoxifen  20 MG tablet Commonly known as: NOLVADEX  TAKE 1 TABLET BY MOUTH EVERY DAY        ASSESSMENT & PLAN:   Assessment:   1. Stage IIA breast cancer which was ER/PR positive and HER2 negative, diagnosed in December 2019.  She completed adjuvant chemotherapy with Adriamycin/cyclophosphamide for 4 cycles in March of 2020.  She was placed on tamoxifen  daily in June 2020, and will continue for a total of 7 years.    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  Unfortunately, she developed a breast cancer.  She has now undergone bilateral mastectomy with postoperative chemotherapy.  She has had a prophylactic bilateral salpingo oophorectomy so we could consider placing her on an alterative aromatase inhibitor if she cannot tolerate the tamoxifen .     Plan: She continues Tamoxifen  20 mg daily without difficulty. Her last bone density scan was done in March, 2024 so she will be due for a  repeat at the beginning of March, and I will get that scheduled. She has a WBC of 7.9, hemoglobin of 13.1, and platelet count of 295,000. Her CMP is completely normal and her TSH level is pending. I will call her with the results. Her PCP also does routine blood work. I will see her back in 6 months for reevaluation. She verbalizes understanding of an agreement to the plans discussed today.  She knows to call the office should any new  questions or concerns arise.   I provided 11 minutes of face-to-face time during this this encounter and > 50% was spent counseling as documented under my assessment and plan.    Wanda VEAR Cornish, MD  [facility] A DEPT OF [facility] [address] [place] [address] Dept: [phone] Dept Fax: [phone]   No orders of the defined types were placed in this encounter.   I,Jasmine M Lassiter,acting as a scribe for Wanda VEAR Cornish, MD.,have documented all relevant documentation on the behalf of Wanda VEAR Cornish, MD,as directed by  Wanda VEAR Cornish, MD while in the presence of Wanda VEAR Cornish, MD.  I have reviewed this report as typed by the medical scribe, and it is complete and accurate.        "

## 2024-03-20 ENCOUNTER — Ambulatory Visit (HOSPITAL_BASED_OUTPATIENT_CLINIC_OR_DEPARTMENT_OTHER)
Admission: EM | Admit: 2024-03-20 | Discharge: 2024-03-20 | Disposition: A | Discharge status: Home / Self Care | Attending: Nurse Practitioner | Admitting: Nurse Practitioner

## 2024-03-20 ENCOUNTER — Emergency Department (HOSPITAL_COMMUNITY)

## 2024-03-20 ENCOUNTER — Encounter (HOSPITAL_BASED_OUTPATIENT_CLINIC_OR_DEPARTMENT_OTHER): Payer: Self-pay

## 2024-03-20 ENCOUNTER — Telehealth: Payer: Self-pay | Admitting: Oncology

## 2024-03-20 ENCOUNTER — Emergency Department (HOSPITAL_COMMUNITY)
Admission: EM | Admit: 2024-03-20 | Discharge: 2024-03-20 | Disposition: A | Discharge status: Home / Self Care | Source: Ambulatory Visit

## 2024-03-20 DIAGNOSIS — K8050 Calculus of bile duct without cholangitis or cholecystitis without obstruction: Secondary | ICD-10-CM | POA: Insufficient documentation

## 2024-03-20 DIAGNOSIS — R1033 Periumbilical pain: Secondary | ICD-10-CM

## 2024-03-20 DIAGNOSIS — K529 Noninfective gastroenteritis and colitis, unspecified: Secondary | ICD-10-CM | POA: Insufficient documentation

## 2024-03-20 DIAGNOSIS — Z170 Estrogen receptor positive status [ER+]: Secondary | ICD-10-CM

## 2024-03-20 DIAGNOSIS — R509 Fever, unspecified: Secondary | ICD-10-CM

## 2024-03-20 DIAGNOSIS — R109 Unspecified abdominal pain: Secondary | ICD-10-CM | POA: Diagnosis present

## 2024-03-20 DIAGNOSIS — R110 Nausea: Secondary | ICD-10-CM

## 2024-03-20 LAB — COMPREHENSIVE METABOLIC PANEL WITH GFR
ALT: 12 U/L (ref 0–44)
AST: 16 U/L (ref 15–41)
Albumin: 3.8 g/dL (ref 3.5–5.0)
Alkaline Phosphatase: 72 U/L (ref 38–126)
Anion gap: 9 (ref 5–15)
BUN: 11 mg/dL (ref 6–20)
CO2: 22 mmol/L (ref 22–32)
Calcium: 8.8 mg/dL — ABNORMAL LOW (ref 8.9–10.3)
Chloride: 107 mmol/L (ref 98–111)
Creatinine, Ser: 0.76 mg/dL (ref 0.44–1.00)
GFR, Estimated: >60 mL/min
Glucose, Bld: 95 mg/dL (ref 70–99)
Potassium: 3.8 mmol/L (ref 3.5–5.1)
Sodium: 138 mmol/L (ref 135–145)
Total Bilirubin: 0.6 mg/dL (ref 0.0–1.2)
Total Protein: 6.2 g/dL — ABNORMAL LOW (ref 6.5–8.1)

## 2024-03-20 LAB — CBC WITH DIFFERENTIAL/PLATELET
Abs Immature Granulocytes: 0.05 10*3/uL (ref 0.00–0.07)
Basophils Absolute: 0 10*3/uL (ref 0.0–0.1)
Basophils Relative: 1 %
Eosinophils Absolute: 0.1 10*3/uL (ref 0.0–0.5)
Eosinophils Relative: 2 %
HCT: 38.1 % (ref 36.0–46.0)
Hemoglobin: 13 g/dL (ref 12.0–15.0)
Immature Granulocytes: 1 %
Lymphocytes Relative: 14 %
Lymphs Abs: 0.9 10*3/uL (ref 0.7–4.0)
MCH: 30 pg (ref 26.0–34.0)
MCHC: 34.1 g/dL (ref 30.0–36.0)
MCV: 88 fL (ref 80.0–100.0)
Monocytes Absolute: 0.6 10*3/uL (ref 0.1–1.0)
Monocytes Relative: 9 %
Neutro Abs: 5.1 10*3/uL (ref 1.7–7.7)
Neutrophils Relative %: 73 %
Platelets: 240 10*3/uL (ref 150–400)
RBC: 4.33 MIL/uL (ref 3.87–5.11)
RDW: 12.5 % (ref 11.5–15.5)
WBC: 6.9 10*3/uL (ref 4.0–10.5)
nRBC: 0 % (ref 0.0–0.2)

## 2024-03-20 LAB — POCT URINE DIPSTICK
Bilirubin, UA: NEGATIVE
Blood, UA: NEGATIVE
Glucose, UA: NEGATIVE mg/dL
Ketones, POC UA: NEGATIVE mg/dL
Leukocytes, UA: NEGATIVE
Nitrite, UA: NEGATIVE
Spec Grav, UA: 1.025
Urobilinogen, UA: 0.2 U/dL
pH, UA: 5.5

## 2024-03-20 LAB — LIPASE, BLOOD: Lipase: 18 U/L (ref 11–51)

## 2024-03-20 MED ORDER — KETOROLAC TROMETHAMINE 60 MG/2ML IM SOLN
60.0000 mg | Freq: Once | INTRAMUSCULAR | Status: AC
  Administered 2024-03-20: 60 mg via INTRAMUSCULAR

## 2024-03-20 MED ORDER — IOHEXOL 300 MG/ML  SOLN
100.0000 mL | Freq: Once | INTRAMUSCULAR | Status: AC | PRN
  Administered 2024-03-20: 100 mL via INTRAVENOUS

## 2024-03-20 NOTE — Telephone Encounter (Signed)
 Patient has been scheduled for follow-up visit per 03/18/2024 LOS.  Pt aware of scheduled appt details.

## 2024-03-20 NOTE — ED Triage Notes (Addendum)
 Pt reports epigastric abd pain x last night at 6pm, pt denies any v/d. Pt endorses a little nauseous. Pt denies any GI hx, pt unsure of what may be causing the pain. Pt denies chest pain. Pt declining lab work at this time, reports she had Labs yesterday at a [place] facility r/t follow up and hx of cancer survivor.

## 2024-03-20 NOTE — Discharge Instructions (Addendum)
 You were seen today for sudden abdominal pain around your belly button that comes and goes in short waves and is associated with mild fever, chills, and nausea. Your exam today and basic labs from yesterday do not show a clear cause, but the pattern of your pain raises concern for a possible blockage or inflammation inside the abdomen that needs more advanced testing than we can provide in clinic. Because of this, you need to go to the emergency department now for further evaluation and imaging. You were given a pain medication injection today to help with discomfort. Do not eat or drink anything until you have been evaluated in the emergency department, as you may need imaging, procedures, or surgery depending on what is found. You may go by private vehicle since you are currently stable.

## 2024-03-20 NOTE — ED Provider Notes (Signed)
 " [facility]    CSN: [id number] Arrival date & time: 03/20/24  0807      History   Chief Complaint Chief Complaint  Patient presents with   Abdominal Pain    HPI Tina Monroe is a 54 y.o. female.   Discussed the use of AI scribe software for clinical note transcription with the patient, who gave verbal consent to proceed.   Tina Monroe is a female patient with a history of breast cancer 6 years ago who presents with sharp abdominal pain that began around 5:30-6:00 PM yesterday evening. The pain is located in the middle of her stomach and is described as feeling like somebody's stabbing me with a knife. She rates the pain as 11/10 in severity when it occurs. The pain comes in episodes of 3-4 stabbing sensations lasting 10-15 seconds each with brief pauses between, followed by periods of relief lasting a few minutes. The episodes have become more frequent since onset.  The patient was seen by her oncologist for her routine 93-month follow-up related to her breast cancer history. During that visit, when her abdomen was palpated, it was mildly uncomfortable but not painful. She has a high tolerance for pain and did not think anything of it at the time. The severe pain began several hours later that evening and kept her awake most of the night.  Aggravating factors include pressing on her abdomen.The pain does not radiate and stays localized to the middle of her stomach. She reports mild nausea but denies vomiting, diarrhea, or blood in stool. She experienced chills last night and could not get warm. She has not eaten since the pain started, making it difficult to assess appetite changes.   The patient denies chest pain, shortness of breath, urinary symptoms, or back pain. She reports normal bowel movements and urination. She has not taken any medications for the pain. She had a hysterectomy as part of her breast cancer treatment and had a double mastectomy. She does not  drink alcohol. She only takes tamoxifen .  The following sections of the patient's history were reviewed and updated as appropriate: allergies, current medications, past family history, past medical history, past social history, past surgical history, and problem list.      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    Class: Chronic   At risk for osteoporosis 01/13/2020   Insulin resistance 12/14/2019   B12 deficiency 12/14/2019   Depression 12/14/2019   Other hyperlipidemia 10/14/2019   Vitamin D  deficiency 10/14/2019   S/P breast reconstruction, bilateral 06/10/2019   Acquired absence of breast and absent nipple, bilateral 05/27/2018   Malignant neoplasm of lower-outer quadrant of right breast of female, estrogen receptor positive (HCC) 02/18/2018   Sprain of right ankle 04/23/2017    Past Surgical History:  Procedure Laterality Date   ABDOMINAL HYSTERECTOMY  09/25/2018   partial this year, other partial in 2003   BREAST RECONSTRUCTION WITH PLACEMENT OF TISSUE EXPANDER AND FLEX HD (ACELLULAR HYDRATED DERMIS) Bilateral 10/23/2018   Procedure: BREAST  RECONSTRUCTION WITH PLACEMENT OF TISSUE EXPANDER AND FLEX HD (ACELLULAR HYDRATED DERMIS);  Surgeon: Lowery Estefana RAMAN, DO;  Location: [facility];  Service: Plastics;  Laterality: Bilateral;  2.5 hours   CARDIAC CATHETERIZATION     post chemo, everything normal   HYSTERECTOMY ABDOMINAL WITH SALPINGECTOMY  2021   MASTECTOMY  02/2018   MENISCUS REPAIR  2000   PORTA CATH INSERTION  02/2018   PORTA CATH REMOVAL     REMOVAL OF  BILATERAL TISSUE EXPANDERS WITH PLACEMENT OF BILATERAL BREAST IMPLANTS Bilateral 01/22/2019   Procedure: REMOVAL OF BILATERAL TISSUE EXPANDERS WITH PLACEMENT OF BILATERAL BREAST IMPLANTS;  Surgeon: Lowery Estefana RAMAN, DO;  Location: [facility];  Service: Plastics;  Laterality: Bilateral;   torn miniscus right knee  2012    OB History     Gravida  3   Para  3   Term      Preterm      AB      Living         SAB      IAB      Ectopic      Multiple      Live Births               Home Medications    Prior to Admission medications  Medication Sig Start Date End Date Taking? Authorizing Provider  tamoxifen  (NOLVADEX ) 20 MG tablet TAKE 1 TABLET BY MOUTH EVERY DAY 02/10/23   Cornelius Wanda DEL, MD    Family History Family History  Problem Relation Age of Onset   Thyroid  disease Mother    Obesity Mother    Cancer Father     Social History Social History[1]   Allergies   Hydromorphone and Hydromorphone hcl   Review of Systems Review of Systems  Constitutional:  Positive for chills and fever (subjective). Negative for appetite change.  Respiratory:  Negative for shortness of breath.   Cardiovascular:  Negative for chest pain.  Gastrointestinal:  Positive for abdominal pain and nausea. Negative for blood in stool, constipation, diarrhea and vomiting.  Genitourinary:  Negative for dysuria.  Musculoskeletal:  Negative for back pain.  All other systems reviewed and are negative.    Physical Exam Triage Vital Signs ED Triage Vitals  Encounter Vitals Group     BP 03/20/24 0823 111/81     Girls Systolic BP Percentile --      Girls Diastolic BP Percentile --      Boys Systolic BP Percentile --      Boys Diastolic BP Percentile --      Pulse Rate 03/20/24 0823 81     Resp 03/20/24 0823 20     Temp 03/20/24 0823 99 F (37.2 C)     Temp src --      SpO2 03/20/24 0823 98 %     Weight --      Height --      Head Circumference --       Peak Flow --      Pain Score 03/20/24 0826 4     Pain Loc --      Pain Education --      Exclude from Growth Chart --    No data found.  Updated Vital Signs BP 111/81 (BP Location: Right Arm)   Pulse 81   Temp 99 F (37.2 C)   Resp 20   SpO2 98%   Visual Acuity Right Eye Distance:   Left Eye Distance:  Bilateral Distance:    Right Eye Near:   Left Eye Near:    Bilateral Near:     Physical Exam Vitals reviewed.  Constitutional:      General: She is awake. She is not in acute distress.    Appearance: Normal appearance. She is well-developed. She is not ill-appearing, toxic-appearing or diaphoretic.  HENT:     Head: Normocephalic.     Right Ear: Hearing normal.     Left Ear: Hearing normal.     Nose: Nose normal.     Mouth/Throat:     Mouth: Mucous membranes are moist.  Eyes:     General: Vision grossly intact.     Conjunctiva/sclera: Conjunctivae normal.  Cardiovascular:     Rate and Rhythm: Normal rate and regular rhythm.     Heart sounds: Normal heart sounds.  Pulmonary:     Effort: Pulmonary effort is normal.     Breath sounds: Normal breath sounds and air entry.  Abdominal:     General: Bowel sounds are decreased. There is no distension.     Palpations: Abdomen is soft.     Tenderness: There is abdominal tenderness in the periumbilical area. There is no right CVA tenderness, left CVA tenderness, guarding or rebound.      Comments: Diffused tenderness to palpation in the periumbilical region without rebound tenderness, guarding, or rigidity. No focal point tenderness identified. No palpable masses, organomegaly, or hernias noted. No overlying erythema, warmth, or ecchymosis.   Musculoskeletal:        General: Normal range of motion.     Cervical back: Normal range of motion and neck supple.  Skin:    General: Skin is warm and dry.  Neurological:     General: No focal deficit present.     Mental Status: She is alert and oriented to person, place, and time.   Psychiatric:        Speech: Speech normal.        Behavior: Behavior is cooperative.      UC Treatments / Results  Labs (all labs ordered are listed, but only abnormal results are displayed) Labs Reviewed  POCT URINE DIPSTICK - Abnormal; Notable for the following components:      Result Value   Clarity, UA hazy (*)    All other components within normal limits    EKG   Radiology No results found.  Procedures Procedures (including critical care time)  Medications Ordered in UC Medications  ketorolac  (TORADOL ) injection 60 mg (60 mg Intramuscular Given 03/20/24 0911)    Initial Impression / Assessment and Plan / UC Course  I have reviewed the triage vital signs and the nursing notes.  Pertinent labs & imaging results that were available during my care of the patient were reviewed by me and considered in my medical decision making (see chart for details).     Patient presents with acute onset episodic periumbilical abdominal pain beginning the prior evening, associated with low-grade fever, chills, and mild nausea without vomiting, diarrhea, or urinary complaints. Pain occurs in brief recurrent waves lasting approximately 10-15 seconds with short symptom-free intervals. Examination demonstrates localized periumbilical tenderness without rebound, guarding, or focal right upper or right lower quadrant tenderness, making primary gallbladder or appendiceal pathology less likely on exam. Recent CBC and CMP are within normal limits and urinalysis is largely unremarkable. Despite reassuring labs, the episodic colicky nature of the pain and associated systemic symptoms raise concern for an early bowel obstruction or evolving intra-abdominal inflammatory process  that cannot be safely excluded in the outpatient setting. Patient was treated with intramuscular ketorolac  for pain control and instructed to remain NPO. Given the need for urgent imaging and possible surgical evaluation, transfer  to the emergency department is recommended for further workup and management. Patient is hemodynamically stable and agrees to proceed to the emergency department via private vehicle. Follow up with primary care is advised after emergency evaluation, and patient was instructed to seek immediate emergency care for worsening pain, persistent vomiting, inability to pass stool or gas, high fever, syncope, or development of localized right lower quadrant pain.  The patient presents with symptoms and exam findings that warrant a higher level of care and further evaluation. Given the clinical picture, emergency department (ED) assessment is recommended for advanced diagnostics and management. The patient is currently stable and appropriate for transport via private vehicle. A responsible adult will be driving. The patient is alert, oriented, demonstrates clear mental status, good insight into their illness, and sound judgment in making decisions regarding their care. The risks, rationale, and need for ED evaluation were discussed, and the patient is agreeable to the plan.  Documentation was completed with the aid of voice recognition software. Transcription may contain typographical errors.  Final Clinical Impressions(s) / UC Diagnoses   Final diagnoses:  Periumbilical abdominal pain     Discharge Instructions      You were seen today for sudden abdominal pain around your belly button that comes and goes in short waves and is associated with mild fever, chills, and nausea. Your exam today and basic labs from yesterday do not show a clear cause, but the pattern of your pain raises concern for a possible blockage or inflammation inside the abdomen that needs more advanced testing than we can provide in clinic. Because of this, you need to go to the emergency department now for further evaluation and imaging. You were given a pain medication injection today to help with discomfort. Do not eat or drink anything  until you have been evaluated in the emergency department, as you may need imaging, procedures, or surgery depending on what is found. You may go by private vehicle since you are currently stable.      ED Prescriptions   None    PDMP not reviewed this encounter.      [1]  Social History Tobacco Use   Smoking status: Never   Smokeless tobacco: Never  Vaping Use   Vaping status: Never Used  Substance Use Topics   Alcohol use: Not Currently   Drug use: No     Tina Lukes, FNP 03/20/24 1209  "

## 2024-03-20 NOTE — ED Provider Notes (Signed)
 " [place] EMERGENCY DEPARTMENT AT [facility] Provider Note   CSN: [id number] Arrival date & time: 03/20/24  9051     Patient presents with: Abdominal Pain   Tina Monroe is a 54 y.o. female.    Abdominal Pain  Patient presents because of abdominal pain.  Epigastric in nature.  Started yesterday around 6 PM.  Ate dinner around 5 PM but is maybe hurting slightly before the dinnertime.  Endorses some nausea.  No vomiting.  States it is more of a sharp shooting pain that last approximate 10 to 15 seconds and subsequent dissipate.  Patient is not having thing to eat since last night.  She is never had pain like this happen before.  She has a mild history of acid reflux but takes no medication for this.  No previous abdominal surgeries.  Patient's been passing stool.  Normal amount of flatulence.  No bright red blood per rectum or melena.  No previous abdominal surgeries.  No fever no chills.  No sick contacts.  Otherwise, feeling at baseline.  No chest pain or shortness of breath.  No pleuritic chest pain or hemoptysis.  No weight loss.  No obvious night sweats but she does state that she felt cold last night.  Previous medical history reviewed : Urgent care today.  Abdominal pain.  CBC yesterday unremarkable.  CMP unremarkable.     Prior to Admission medications  Medication Sig Start Date End Date Taking? Authorizing Provider  tamoxifen  (NOLVADEX ) 20 MG tablet TAKE 1 TABLET BY MOUTH EVERY DAY 02/10/23   Cornelius Wanda DEL, MD    Allergies: Hydromorphone and Hydromorphone hcl    Review of Systems  Gastrointestinal:  Positive for abdominal pain.    Updated Vital Signs BP 109/68 (BP Location: Right Arm)   Pulse 69   Temp 98.2 F (36.8 C) (Oral)   Resp 17   Ht 5' 3 (1.6 m)   Wt 81.6 kg   SpO2 97%   BMI 31.89 kg/m   Physical Exam Vitals and nursing note reviewed.  Constitutional:      General: She is not in acute distress.    Appearance: She is  well-developed.  HENT:     Head: Normocephalic and atraumatic.  Eyes:     Conjunctiva/sclera: Conjunctivae normal.  Cardiovascular:     Rate and Rhythm: Normal rate and regular rhythm.     Heart sounds: No murmur heard. Pulmonary:     Effort: Pulmonary effort is normal. No respiratory distress.     Breath sounds: Normal breath sounds.  Abdominal:     Palpations: Abdomen is soft.     Tenderness: There is no abdominal tenderness.   Musculoskeletal:        General: No swelling.     Cervical back: Neck supple.  Skin:    General: Skin is warm and dry.     Capillary Refill: Capillary refill takes less than 2 seconds.  Neurological:     Mental Status: She is alert.  Psychiatric:        Mood and Affect: Mood normal.     (all labs ordered are listed, but only abnormal results are displayed) Labs Reviewed  COMPREHENSIVE METABOLIC PANEL WITH GFR - Abnormal; Notable for the following components:      Result Value   Calcium 8.8 (*)    Total Protein 6.2 (*)    All other components within normal limits  CBC WITH DIFFERENTIAL/PLATELET  LIPASE, BLOOD    EKG: EKG  Interpretation Date/Time:  Friday March 20 2024 11:38:24 EST Ventricular Rate:  66 PR Interval:  176 QRS Duration:  100 QT Interval:  442 QTC Calculation: 464 R Axis:   -14  Text Interpretation: Sinus rhythm Low voltage, precordial leads Confirmed by Simon Rea [phone]) on 03/20/2024 11:44:53 AM  Radiology: CT ABDOMEN PELVIS W CONTRAST Result Date: 03/20/2024 EXAM: CT ABDOMEN AND PELVIS WITH CONTRAST 03/20/2024 12:14:56 PM TECHNIQUE: CT of the abdomen and pelvis was performed with the administration of intravenous contrast. Multiplanar reformatted images are provided for review. Automated exposure control, iterative reconstruction, and/or weight-based adjustment of the mA/kV was utilized to reduce the radiation dose to as low as reasonably achievable. COMPARISON: None available. CLINICAL HISTORY: Epigastric pain. Nausea.  Evaluation for obvious cholecystitis or evidence of duodenitis. FINDINGS: LOWER CHEST: Posterior bibasilar dependent atelectasis. LIVER: The liver is unremarkable. GALLBLADDER AND BILE DUCTS: Multiple small radiopaque gallstones. No wall thickening. No biliary ductal dilatation. SPLEEN: No acute abnormality. 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 PANCREAS: No acute abnormality. ADRENAL GLANDS: No acute abnormality. KIDNEYS, URETERS AND BLADDER: No stones in the kidneys or ureters. No hydronephrosis. No perinephric or periureteral stranding. The urinary bladder is decompressed. GI AND BOWEL: Mild to moderate focal wall thickening of a long segment of small bowel in the right lower quadrant with mesenteric edema. Stomach demonstrates no acute abnormality. There is no bowel obstruction. PERITONEUM AND RETROPERITONEUM: No ascites. No free air. No free pelvic fluid. No mesenteric or portal venous gas. VASCULATURE: Aorta is normal in caliber. LYMPH NODES: No lymphadenopathy. REPRODUCTIVE ORGANS: Hysterectomy. BONES AND SOFT TISSUES: Partially visualized bilateral breast implants. Mild multilevel degenerative changes of the thoracolumbar spine. No acute osseous abnormality. No focal soft tissue abnormality. IMPRESSION: 1. Findings consistent with an infectious or inflammatory ileitis. 2. Normal decompressed appendix. 3. Cholecystolithiasis. Electronically signed by: Rogelia Myers MD 03/20/2024 01:20 PM EST RP Workstation: [id number]     Procedures   Medications Ordered in the ED  iohexol  (OMNIPAQUE ) 300 MG/ML solution 100 mL (100 mLs Intravenous Contrast Given 03/20/24 1215)                                    Medical Decision Making Amount and/or Complexity of Data Reviewed Labs: ordered. Radiology: ordered.  Risk Prescription drug management.     HPI:   Patient presents because of abdominal pain.  Epigastric in nature.  Started yesterday around 6 PM.   Ate dinner around 5 PM but is maybe hurting slightly before the dinnertime.  Endorses some nausea.  No vomiting.  States it is more of a sharp shooting pain that last approximate 10 to 15 seconds and subsequent dissipate.  Patient is not having thing to eat since last night.  She is never had pain like this happen before.  She has a mild history of acid reflux but takes no medication for this.  No previous abdominal surgeries.  Patient's been passing stool.  Normal amount of flatulence.  No bright red blood per rectum or melena.  No previous abdominal surgeries.  No fever no chills.  No sick contacts.  Otherwise, feeling at baseline.  No chest pain or shortness of breath.  No pleuritic chest pain or hemoptysis.  No weight loss.  No obvious night sweats but she does state that she felt cold last night.  Previous medical history reviewed : Urgent care today.  Abdominal pain.  CBC  yesterday unremarkable.  CMP unremarkable.  MDM:   Upon examination, patient hemodynamically stable. A&O x 3 with GCS 15.  Pain to palpation epigastric area.  Concern for pathology such as gastritis versus gastric ulcer versus duodenitis versus cholecystitis versus cholelithiasis versus pancreatitis.  Obtain lipase, CBC, CMP.  Obtain CT scan to rule out above pathology.  Patient does not need pain medication at this time.  EKG.  No chest pain or shortness of breath.  Reevaluation:   Upon reexamination, patient hemodynamically stable.  Remains A&O x 3 with GCS 15.  Lipase normal.  LFTs normal.  Overall, labs very reassuring at this time.   CT scan did show cholelithiasis.  Alk phos normal.  Once again, liver enzymes normal.  No significant postprandial pain that be concerning for symptomatic cholelithiasis but still think this is a possibility given the degree of gallstones and area of pain.  I think it is reasonable for her to follow-up with gastroenterology as well as general surgery about this.  Recommended low-fat diet.   Strict return precautions for fever with vomiting.  CT scan also showed findings consistent for ileitis.  No diarrhea but this is around where patient is hurting.  No risk factors for acute ischemic disease process to this area.  Unclear whether or not this to be inflammatory versus infectious.  No medications at because of this that she is taking.  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  Will not treat with antibiotics at this time given that she is immunocompetent and that should pass if it is infectious.  I do recommend she follow-up with gastroenterology in the setting of need to rule out inflammatory conditions such as Crohn's.  Feel like this is less likely but still reasonable for her to follow-up to have this completed outpatient.  Concern for gastritis or GERD.  No concerns for gastric ulcer based off exam as well as history.  More likely to be this ileitis   Strict return precautions    EKG Interpreted by Me: sinus    I have independently interpreted the  CT  images and agree with the radiologist finding   Social Determinant of Health: no drugs/alcohol    Disposition and Follow Up: GI, Gen surgery       Final diagnoses:  Ileitis  Choledocholithiasis    ED Discharge Orders          Ordered    Ambulatory referral to Gastroenterology       Comments: Ileitis   03/20/24 1326               Simon Lavonia SAILOR, MD 03/20/24 1407  "

## 2024-03-20 NOTE — ED Notes (Signed)
 Patient is being discharged from the Urgent Care and sent to the Emergency Department via pov . Per CANDIE Sarin, FNP, patient is in need of higher level of care due to abd pain. Patient is aware and verbalizes understanding of plan of care.  Vitals:   03/20/24 0823  BP: 111/81  Pulse: 81  Resp: 20  Temp: 99 F (37.2 C)  SpO2: 98%

## 2024-03-20 NOTE — Discharge Instructions (Addendum)
 I have placed a referral to gastroenterology.  They should be given a call but I do encourage you to reach out to them as well.  I included the number.  Also want you to follow-up with surgery.  You do have a lot of gallstones and gallbladder.  If you start noticing you having worsening pain after eating that this would be indicative of symptomatic gallbladder disease.  Please eat a low-fat diet.  Avoid fried or fatty foods.   You have what we call ileitis.  What this is this is inflammation of a specific part of the small bowel that can cause pain.  Sometimes it can cause diarrhea as well.  This could be either infectious or inflammatory in nature.  Inflammatory, things she think about this pathology such as Crohn's.  This is something and to be ruled out by gastroenterology.  If infectious, it can be both bacterial and viral. Even if it is bacterial, we do not treated with antibiotics.  If this is  an infection it will resolve on its own in time.  Things to watch out for: Bloody diarrhea, fever, significantly worsening abdominal pain.  For pain, you can take 1000 mg of Tylenol  or 1 g of Tylenol  every 6-8 hours.  Do not exceed more than 4000 mg or 4 g in a 24-hour period.  You can also take ibuprofen 600 to 800 mg every 6-8 hours as well.  Do not take this high-dose ibuprofen for greater than a week.  If you start having more acid reflux symptoms with this pain and consider taking famotidine though I do not think this is acid reflux related.

## 2024-03-24 ENCOUNTER — Encounter: Payer: Self-pay | Admitting: Gastroenterology

## 2024-04-16 ENCOUNTER — Ambulatory Visit: Admitting: Gastroenterology

## 2024-04-24 ENCOUNTER — Other Ambulatory Visit (HOSPITAL_BASED_OUTPATIENT_CLINIC_OR_DEPARTMENT_OTHER): Admitting: Radiology

## 2024-09-16 ENCOUNTER — Inpatient Hospital Stay: Admitting: Oncology
